# Patient Record
Sex: Male | Born: 1970
Health system: Southern US, Community
[De-identification: ages and names within clinical notes are randomized; demographics above are authoritative.]

## PROBLEM LIST (undated history)

## (undated) DIAGNOSIS — L309 Dermatitis, unspecified: Secondary | ICD-10-CM

## (undated) DIAGNOSIS — G4730 Sleep apnea, unspecified: Secondary | ICD-10-CM

## (undated) DIAGNOSIS — M549 Dorsalgia, unspecified: Secondary | ICD-10-CM

## (undated) HISTORY — PX: OTHER SURGICAL HISTORY: SHX169

## (undated) HISTORY — PX: COLONOSCOPY: SHX174

## (undated) HISTORY — PX: APPENDECTOMY: SHX54

---

## 1999-04-20 ENCOUNTER — Emergency Department (HOSPITAL_COMMUNITY): Admission: EM | Admit: 1999-04-20 | Discharge: 1999-04-20 | Payer: Self-pay | Admitting: *Deleted

## 2001-09-26 ENCOUNTER — Emergency Department (HOSPITAL_COMMUNITY): Admission: EM | Admit: 2001-09-26 | Discharge: 2001-09-27 | Payer: Self-pay | Admitting: Emergency Medicine

## 2011-08-29 ENCOUNTER — Other Ambulatory Visit: Payer: Self-pay | Admitting: Physician Assistant

## 2011-09-01 ENCOUNTER — Telehealth: Payer: Self-pay

## 2011-09-01 NOTE — Telephone Encounter (Signed)
[facility] HAS REQUESTED REFILLS ON PT'S CIALIS.  PT WOULD LIKE REFILLS FAXED TO [phone] AS SOON AS WE CAN

## 2011-09-02 NOTE — Telephone Encounter (Signed)
Pull chart please 

## 2011-09-03 NOTE — Telephone Encounter (Signed)
Patient has not been seen since 12/11. He needs an office visit for refills

## 2011-09-03 NOTE — Telephone Encounter (Signed)
Chart is at nurses station

## 2011-09-03 NOTE — Telephone Encounter (Signed)
lmom that pt must RTC

## 2011-09-04 ENCOUNTER — Telehealth: Payer: Self-pay

## 2011-09-04 NOTE — Telephone Encounter (Signed)
PT WOULD ONLY LIKE DR DAUB TO GIVE HIM A CALL AND IT ISN'T ABOUT MEDICINE OR DIAGNOSIS PLEASE CALL [phone] AND HE UNDERSTAND DR DAUB ISN'T IN UNTIL Thursday

## 2011-09-04 NOTE — Telephone Encounter (Signed)
Message left on answering machine for him to call me at home tonight.

## 2011-09-06 ENCOUNTER — Other Ambulatory Visit: Payer: Self-pay | Admitting: *Deleted

## 2011-09-06 MED ORDER — TADALAFIL 20 MG PO TABS: ORAL_TABLET | ORAL | Status: DC

## 2012-03-21 ENCOUNTER — Telehealth: Payer: Self-pay

## 2012-03-21 NOTE — Telephone Encounter (Signed)
Do you know what med she is referring to?

## 2012-03-21 NOTE — Telephone Encounter (Signed)
PT STATES DR DAUB WANTED [place] TO PUT THIS MESSAGE IN HIS BOX TO REMIND HIM ABOUT HIS REFILLS. STATES HE KNEW WHICH ONES THEY ARE PLEASE CALL PT IF NEEDED AT [phone]

## 2012-03-22 MED ORDER — TADALAFIL 20 MG PO TABS: ORAL_TABLET | ORAL | Status: DC

## 2012-03-22 NOTE — Telephone Encounter (Signed)
Please call in Cialis 20 mg. He takes one half tablet one hour prior to intercourse up to every 48 hours. Call in #6 refill x1 year

## 2012-07-07 ENCOUNTER — Encounter (HOSPITAL_COMMUNITY): Payer: Self-pay | Admitting: *Deleted

## 2012-07-07 ENCOUNTER — Emergency Department (HOSPITAL_COMMUNITY): Payer: Worker's Compensation

## 2012-07-07 ENCOUNTER — Emergency Department (HOSPITAL_COMMUNITY)
Admission: EM | Admit: 2012-07-07 | Discharge: 2012-07-08 | Disposition: A | Discharge status: Home / Self Care | Payer: Worker's Compensation | Attending: Emergency Medicine | Admitting: Emergency Medicine

## 2012-07-07 DIAGNOSIS — M545 Low back pain, unspecified: Secondary | ICD-10-CM | POA: Insufficient documentation

## 2012-07-07 DIAGNOSIS — M549 Dorsalgia, unspecified: Secondary | ICD-10-CM

## 2012-07-07 DIAGNOSIS — R209 Unspecified disturbances of skin sensation: Secondary | ICD-10-CM | POA: Insufficient documentation

## 2012-07-07 DIAGNOSIS — Z79899 Other long term (current) drug therapy: Secondary | ICD-10-CM | POA: Insufficient documentation

## 2012-07-07 DIAGNOSIS — R32 Unspecified urinary incontinence: Secondary | ICD-10-CM

## 2012-07-07 DIAGNOSIS — G8929 Other chronic pain: Secondary | ICD-10-CM | POA: Insufficient documentation

## 2012-07-07 LAB — URINALYSIS, ROUTINE W REFLEX MICROSCOPIC
Glucose, UA: NEGATIVE mg/dL
Hgb urine dipstick: NEGATIVE
Ketones, ur: NEGATIVE mg/dL
Protein, ur: NEGATIVE mg/dL

## 2012-07-07 NOTE — ED Provider Notes (Signed)
Medical screening examination/treatment/procedure(s) were performed by non-physician practitioner and as supervising physician I was immediately available for consultation/collaboration. Devoria Albe, MD, Armando Gang   Ward Givens, MD 07/07/12 [phone]

## 2012-07-07 NOTE — ED Notes (Signed)
The pt has chronic back pain and for the past 2 weeks the pt has had more pain

## 2012-07-07 NOTE — ED Notes (Addendum)
Pt. Reports lower back pain x2 week, was lifting something heavy. States shooting pain from lower back to left leg when walking. Also reports inability to bend over. Pt. Ambulatory upon arrival but states intermittently has trouble walking. Pt. Alert and oriented x4. Pt. Laying flat currently.

## 2012-07-07 NOTE — ED Provider Notes (Signed)
History    This chart was scribed for non-physician practitioner Raymon Mutton working with Ward Givens, MD by Quintella Reichert, ED Scribe. This patient was seen in room TR08C/TR08C and the patient's care was started at 7:43 PM .   CSN: [id number]  Arrival date & time 07/07/12  1910      Chief Complaint  Patient presents with  . Back Pain     The history is provided by the patient. No language interpreter was used.   Martin Velazquez is a 42 y.o. male who presents to the Emergency Department complaining of constant, severe left-side lower back pain.  Pt states he sustained a ruptured disc ( in an injury 9 years ago, and subsequently experienced intermittent, moderate lower back pain.  2 weeks ago he sustained another injury, which precipitated a return of the same pain, but much more severe and constant than previously.  He describes pain as aching and currently at a severity of 7/10,  and states it is exacerbated by bending forward.  He reports that pain radiates down left leg.  He also reports numbness to inner left thigh and left foot.  Pt also reports urinary incontinence (3 episodes) and erectile dysfunction with onset 4 days ago.  He denies prior h/o either of these symptoms.  He also reports mild testicular aching and a tingling sensation in head of penis.  Pt denies CP, SOB, abdominal pain, nausea, emesis, diarrhea, facial asymmetry, trouble swallowing, weakness, or any other associated symptoms.  He was medicating with prednisone, but ran out several days ago.  Pt also has a prescription for hydrocodone but has not been taking it.  Neurosurgeon is Dr. Lovell Sheehan.   History reviewed. No pertinent past medical history.  History reviewed. No pertinent past surgical history.  No family history on file.  History  Substance Use Topics  . Smoking status: Never Smoker   . Smokeless tobacco: Not on file  . Alcohol Use: No      Review of Systems  HENT: Negative for trouble  swallowing.   Respiratory: Negative for shortness of breath.   Cardiovascular: Negative for chest pain.  Gastrointestinal: Negative for nausea, vomiting, abdominal pain and diarrhea.  Genitourinary:       Urinary incontinence, erectile dysfunction, tingling to glans penis  Musculoskeletal: Positive for back pain.  Neurological: Positive for numbness (Inner thigh and left foot). Negative for facial asymmetry and weakness.  All other systems reviewed and are negative.    Allergies  Review of patient's allergies indicates not on file.  Home Medications   Current Outpatient Rx  Name  Route  Sig  Dispense  Refill  . Multiple Vitamin (MULTIVITAMIN WITH MINERALS) TABS   Oral   Take 1 tablet by mouth daily.         . tadalafil (CIALIS) 20 MG tablet      Take 1/2 to 1 tablet prior to intercourse   6 tablet   11   . predniSONE (DELTASONE) 20 MG tablet   Oral   Take 20 mg by mouth daily. 6 tabs, 5 tabs, 4 tabs, 3 tabs, 2 tabs, 1 tab, then took 1 tab qd for 3 or 4 days, then stopped.           BP 150/71  Pulse 58  Temp(Src) 98.6 F (37 C) (Oral)  Resp 18  SpO2 99%  Physical Exam  Nursing note and vitals reviewed. Constitutional: He is oriented to person, place, and time. He appears well-developed and  well-nourished. No distress.  HENT:  Head: Normocephalic and atraumatic.  Mouth/Throat: Oropharynx is clear and moist. No oropharyngeal exudate.  Eyes: Conjunctivae and EOM are normal. Pupils are equal, round, and reactive to light. Right eye exhibits no discharge. Left eye exhibits no discharge.  Neck: Normal range of motion. Neck supple. No tracheal deviation present.  Negative lymphadenopathy  Cardiovascular: Normal rate, regular rhythm and normal heart sounds.   No murmur heard. Pedal pulses bounding, 2+ bilaterally  Pulmonary/Chest: Effort normal and breath sounds normal. No respiratory distress. He has no wheezes. He has no rales.  Genitourinary: Rectum normal.   Musculoskeletal: He exhibits no edema and no tenderness.  Decreased flexion to back, restricted due to pain. Normal extension of back. Full flexion of hips. Decreased flexion of left knee due to radiation of pain in lower back. Full ROM in feet.    Lymphadenopathy:    He has no cervical adenopathy.  Neurological: He is alert and oriented to person, place, and time. He has normal reflexes. No cranial nerve deficit. He exhibits normal muscle tone. Coordination normal.  Decreased sensation to inner aspect of left thigh.  Skin: Skin is warm and dry. No rash noted. No erythema.  Psychiatric: He has a normal mood and affect. His behavior is normal. Thought content normal.    ED Course  Procedures (including critical care time)  DIAGNOSTIC STUDIES: Oxygen Saturation is 99% on room air, normal by my interpretation.    COORDINATION OF CARE: 7:59 PM-Discussed treatment plan which includes imaging with pt at bedside and pt agreed to plan. Pt declines pain medication currently.     Labs Reviewed  URINALYSIS, ROUTINE W REFLEX MICROSCOPIC   Mr Lumbar Spine Wo Contrast  07/07/2012  *RADIOLOGY REPORT*  Clinical Data: 42 year old male with severe left side low back pain.  Urinary incontinence.  History of L5-S1 herniated disc.  MRI LUMBAR SPINE WITHOUT CONTRAST  Technique:  Multiplanar and multiecho pulse sequences of the lumbar spine were obtained without intravenous contrast.  Comparison: [facility] lumbar MRI 03/09/2007.  Findings: Lumbar segmentation appears to be normal, and by report this corresponds to the numbering system used on the comparison.  Stable vertebral height and alignment.  Trace retrolisthesis of L5 on S1.  Mild if any marrow edema at the right L5-S1 facet joint. No marrow edema or evidence of acute osseous abnormality elsewhere.   Visualized lower thoracic spinal cord is normal with conus medularis at T12-L1. Visualized abdominal viscera and paraspinal soft tissues  are within normal limits.  There is a degree of lumbar congenital spinal canal narrowing related to short pedicles.  The following superimposed degenerative changes are noted:  T11-T12:  Small chronic inferior T11 endplate Schmorl's node.  Mild to moderate left facet hypertrophy.  Mild left T11 foraminal stenosis.  This appears not significantly changed.  T12-L1:  Negative.  L1-L2:  Minor disc desiccation.  Chronic small right paracentral annular tear and disc protrusion (series 6 image 4).  Mild left facet hypertrophy.  Mild right lateral recess stenosis.  L2-L3:  Negative.  L3-L4:  Negative.  Mild bilateral L3 foraminal stenosis, mostly congenital.  L4-L5:  Negative disc.  Mild to moderate facet hypertrophy.  Mild to moderate left L4 foraminal stenosis not significantly changed.  L5-S1:  Chronic disc desiccation and circumferential disc osteophyte complex.  Interval increased disc space loss. Regression of the previously seen central annular tear.  Left paracentral disc protrusion mostly effecting the left lateral recess (series 6 image 29) at the descending  left S1 nerve root level. The left side lateral recess stenosis is moderate and increased.  Superimposed mild facet hypertrophy.  No significant spinal stenosis.  Mild to moderate left L5 foraminal stenosis is stable.  IMPRESSION:  1.  Chronic L5-S1 disc degeneration with interval increased left lateral recess stenosis related primarily to a small disc protrusion.  Chronic mild to moderate left L5 foraminal stenosis is stable. 2.  Congenital lumbar spinal canal narrowing related short pedicles. 3. Stable lumbar spine outside of the L5-S1 levels including a small right paracentral disc protrusion and annular tear at L1-L2, and mild to moderate multifactorial left L4 foraminal stenosis.   Original Report Authenticated By: Erskine Speed, M.D.      1. Back pain   2. Urinary incontinence       MDM  I personally performed the services described in this  documentation, which was scribed in my presence. The recorded information has been reviewed and is accurate.  With presence of sudden urinary incontinence, erectile dysfunction, tingling and numbness to glans penis, decreased sensation to inner aspect of left thigh upon physical exam - worrisome for Cauda Equina Syndrome. MRI ordered. Patient was transferred from [facility] to Pod B. Discussed case with Elpidio Anis, PA-C; transfer of care to Elpidio Anis, PA-C at 9:00pm 07/07/2012. S. Upstill, PA-C spoke with Dr. Venetia Maxon for neurosurgery consulted at 9:22pm - MRI acknowledged. MRI of lumbar spine: chronic disc degeneration and foraminal stenosis noted throughout lumbar spine - annular tear noted at L1-L2. UA negative findings. Patient was discharged and recommended to follow-up with Dr. Lovell Sheehan as outpatient.     Raymon Mutton, PA-C 07/08/12 1108

## 2012-07-07 NOTE — ED Notes (Addendum)
Patient had spinal injury  Back in 2005 L5-S1.  Patient states that he has lost control of his bladder and cannot have an erection. Patient also reports having numbness and tingling in his penis and testicles.  Patient in supine position, PA did not advise placing patient on back board or putting patient in C-Collar.  Provider and charge nurse notified patient will move to Pod B room 15

## 2012-07-07 NOTE — ED Provider Notes (Signed)
Patient seen with Santo Held, PA-C. Complaint of back pain since heavy lifting 2 weeks ago that radiated into left leg. Pain has been persistent. Three days ago, he began to have urinary incontinence, noticing wet clothing and sheets when he got up in the morning, and sometimes being unable to get to the bathroom before losing some urine. He does have periods when he maintains full control. No numbness to rectal area, no bowel incontinence. He is also having difficulty with erections, being unable to have an erect penis. No  Abdominal pain. PE: No swelling to back, lumbar area non-tender.  Abdomen soft and non-tender. FROM lower extremities with equal DTR's. No strength deficits. Slightly decreased sensation to left medial thigh.  No loss of rectal tone.  Discussed with Dr. Venetia Maxon who recommended getting an MRI this evening given history of incontinence. MRI shows chronic/stable changes. Patient can be discharged home and will follow up with Dr. Lovell Sheehan (history of having been seen by him) in the office.   Arnoldo Hooker, PA-C 07/07/12 2357

## 2012-07-10 NOTE — ED Provider Notes (Signed)
Medical screening examination/treatment/procedure(s) were performed by non-physician practitioner and as supervising physician I was immediately available for consultation/collaboration. Kayse Puccini, MD, FACEP   Hibba Schram L Shere Eisenhart, MD 07/10/12 1055 

## 2012-07-19 ENCOUNTER — Other Ambulatory Visit: Payer: Self-pay | Admitting: Neurosurgery

## 2012-07-24 ENCOUNTER — Encounter (HOSPITAL_COMMUNITY): Payer: Self-pay

## 2012-07-24 ENCOUNTER — Encounter (HOSPITAL_COMMUNITY)
Admission: RE | Admit: 2012-07-24 | Discharge: 2012-07-24 | Disposition: A | Discharge status: Home / Self Care | Payer: 59 | Source: Ambulatory Visit | Attending: Neurosurgery | Admitting: Neurosurgery

## 2012-07-24 HISTORY — DX: Dermatitis, unspecified: L30.9

## 2012-07-24 HISTORY — DX: Sleep apnea, unspecified: G47.30

## 2012-07-24 HISTORY — DX: Dorsalgia, unspecified: M54.9

## 2012-07-24 LAB — BASIC METABOLIC PANEL
BUN: 22 mg/dL (ref 6–23)
Chloride: 101 mEq/L (ref 96–112)
GFR calc Af Amer: 89 mL/min — ABNORMAL LOW (ref >90)
Glucose, Bld: 83 mg/dL (ref 70–99)
Potassium: 4.2 mEq/L (ref 3.5–5.1)
Sodium: 138 mEq/L (ref 135–145)

## 2012-07-24 LAB — CBC
HCT: 37.8 % — ABNORMAL LOW (ref 39.0–52.0)
Hemoglobin: 13.4 g/dL (ref 13.0–17.0)
MCH: 30.5 pg (ref 26.0–34.0)
MCHC: 35.4 g/dL (ref 30.0–36.0)
RBC: 4.39 MIL/uL (ref 4.22–5.81)

## 2012-07-24 NOTE — Progress Notes (Addendum)
Pt doesn't have a cardiologist  Stress test done about 2-72months ago with [facility]--done for work-normal  EkG has been done within the past couple of months-was done for work.states it was normal  Denies echo or heart cath  Pt doesn't have a medical MD  Denies CXR in past yr

## 2012-07-24 NOTE — Pre-Procedure Instructions (Signed)
Martin Velazquez  07/24/2012   Your procedure is scheduled on:  Thurs, May 8 @ 10:05 AM  Report to [facility] at 7:00 AM.  Call this number if you have problems the morning of surgery: [phone]   Remember:   Do not eat food or drink liquids after midnight.      Do not wear jewelry.  Do not wear lotions, powders, or colognes. You may wear deodorant.  Men may shave face and neck.  Do not bring valuables to the hospital.  Contacts, dentures or bridgework may not be worn into surgery.  Leave suitcase in the car. After surgery it may be brought to your room.  For patients admitted to the hospital, checkout time is 11:00 AM the day of  discharge.   Patients discharged the day of surgery will not be allowed to drive  home.    Special Instructions: Shower using CHG 2 nights before surgery and the night before surgery.  If you shower the day of surgery use CHG.  Use special wash - you have one bottle of CHG for all showers.  You should use approximately 1/3 of the bottle for each shower.   Please read over the following fact sheets that you were given: Pain Booklet, Coughing and Deep Breathing, MRSA Information and Surgical Site Infection Prevention

## 2012-07-25 ENCOUNTER — Encounter (HOSPITAL_COMMUNITY)
Admission: RE | Disposition: A | Discharge status: Home / Self Care | Payer: Self-pay | Source: Ambulatory Visit | Attending: Neurosurgery

## 2012-07-25 ENCOUNTER — Ambulatory Visit (HOSPITAL_COMMUNITY): Payer: 59

## 2012-07-25 ENCOUNTER — Encounter (HOSPITAL_COMMUNITY): Payer: Self-pay | Admitting: Certified Registered Nurse Anesthetist

## 2012-07-25 ENCOUNTER — Ambulatory Visit (HOSPITAL_COMMUNITY)
Admission: RE | Admit: 2012-07-25 | Discharge: 2012-07-25 | Disposition: A | Discharge status: Home / Self Care | Payer: 59 | Source: Ambulatory Visit | Attending: Neurosurgery | Admitting: Neurosurgery

## 2012-07-25 ENCOUNTER — Ambulatory Visit (HOSPITAL_COMMUNITY): Payer: 59 | Admitting: Certified Registered Nurse Anesthetist

## 2012-07-25 DIAGNOSIS — Z01812 Encounter for preprocedural laboratory examination: Secondary | ICD-10-CM | POA: Insufficient documentation

## 2012-07-25 DIAGNOSIS — M5126 Other intervertebral disc displacement, lumbar region: Secondary | ICD-10-CM | POA: Insufficient documentation

## 2012-07-25 HISTORY — PX: LUMBAR LAMINECTOMY/DECOMPRESSION MICRODISCECTOMY: SHX5026

## 2012-07-25 SURGERY — LUMBAR LAMINECTOMY/DECOMPRESSION MICRODISCECTOMY 1 LEVEL
Anesthesia: General | Site: Back | Wound class: Clean

## 2012-07-25 MED ORDER — CEFAZOLIN SODIUM-DEXTROSE 2-3 GM-% IV SOLR: 2.0000 g | INTRAVENOUS | Status: DC

## 2012-07-25 MED ORDER — ROCURONIUM BROMIDE 100 MG/10ML IV SOLN
INTRAVENOUS | Status: DC | PRN
  Administered 2012-07-25: 50 mg via INTRAVENOUS

## 2012-07-25 MED ORDER — HYDROCODONE-ACETAMINOPHEN 5-325 MG PO TABS: 1.0000 | ORAL_TABLET | ORAL | Status: DC | PRN

## 2012-07-25 MED ORDER — OXYCODONE-ACETAMINOPHEN 5-325 MG PO TABS: 1.0000 | ORAL_TABLET | ORAL | Status: DC | PRN

## 2012-07-25 MED ORDER — ONDANSETRON HCL 4 MG/2ML IJ SOLN: 4.0000 mg | INTRAMUSCULAR | Status: DC | PRN

## 2012-07-25 MED ORDER — ACETAMINOPHEN 650 MG RE SUPP: 650.0000 mg | RECTAL | Status: DC | PRN

## 2012-07-25 MED ORDER — OXYCODONE HCL 5 MG PO TABS: 5.0000 mg | ORAL_TABLET | Freq: Once | ORAL | Status: DC | PRN

## 2012-07-25 MED ORDER — BUPIVACAINE-EPINEPHRINE PF 0.5-1:200000 % IJ SOLN
INTRAMUSCULAR | Status: DC | PRN
  Administered 2012-07-25 (×2): 10 mL

## 2012-07-25 MED ORDER — BACITRACIN 50000 UNITS IM SOLR
INTRAMUSCULAR | Status: AC
  Filled 2012-07-25: qty 1

## 2012-07-25 MED ORDER — ARTIFICIAL TEARS OP OINT
TOPICAL_OINTMENT | OPHTHALMIC | Status: DC | PRN
  Administered 2012-07-25: 1 via OPHTHALMIC

## 2012-07-25 MED ORDER — SODIUM CHLORIDE 0.9 % IV SOLN
INTRAVENOUS | Status: AC
  Filled 2012-07-25: qty 500

## 2012-07-25 MED ORDER — HYDROMORPHONE HCL PF 1 MG/ML IJ SOLN: 0.2500 mg | INTRAMUSCULAR | Status: DC | PRN

## 2012-07-25 MED ORDER — LACTATED RINGERS IV SOLN
INTRAVENOUS | Status: DC | PRN
  Administered 2012-07-25 (×2): via INTRAVENOUS

## 2012-07-25 MED ORDER — LACTATED RINGERS IV SOLN: INTRAVENOUS | Status: DC

## 2012-07-25 MED ORDER — DSS 100 MG PO CAPS: 100.0000 mg | ORAL_CAPSULE | Freq: Two times a day (BID) | ORAL | Status: DC

## 2012-07-25 MED ORDER — BACITRACIN ZINC 500 UNIT/GM EX OINT
TOPICAL_OINTMENT | CUTANEOUS | Status: DC | PRN
  Administered 2012-07-25: 1 via TOPICAL

## 2012-07-25 MED ORDER — FENTANYL CITRATE 0.05 MG/ML IJ SOLN
INTRAMUSCULAR | Status: DC | PRN
  Administered 2012-07-25 (×2): 75 ug via INTRAVENOUS
  Administered 2012-07-25: 50 ug via INTRAVENOUS

## 2012-07-25 MED ORDER — ONDANSETRON HCL 4 MG/2ML IJ SOLN
INTRAMUSCULAR | Status: DC | PRN
  Administered 2012-07-25: 4 mg via INTRAVENOUS

## 2012-07-25 MED ORDER — MIDAZOLAM HCL 5 MG/5ML IJ SOLN
INTRAMUSCULAR | Status: DC | PRN
  Administered 2012-07-25 (×2): 2 mg via INTRAVENOUS

## 2012-07-25 MED ORDER — GLYCOPYRROLATE 0.2 MG/ML IJ SOLN
INTRAMUSCULAR | Status: DC | PRN
  Administered 2012-07-25: .6 mg via INTRAVENOUS

## 2012-07-25 MED ORDER — LIDOCAINE HCL 4 % MT SOLN
OROMUCOSAL | Status: DC | PRN
  Administered 2012-07-25: 4 mL via TOPICAL

## 2012-07-25 MED ORDER — PROPOFOL 10 MG/ML IV BOLUS
INTRAVENOUS | Status: DC | PRN
  Administered 2012-07-25: 200 mg via INTRAVENOUS

## 2012-07-25 MED ORDER — ACETAMINOPHEN 325 MG PO TABS: 650.0000 mg | ORAL_TABLET | ORAL | Status: DC | PRN

## 2012-07-25 MED ORDER — HEMOSTATIC AGENTS (NO CHARGE) OPTIME
TOPICAL | Status: DC | PRN
  Administered 2012-07-25: 1 via TOPICAL

## 2012-07-25 MED ORDER — THROMBIN 5000 UNITS EX SOLR
CUTANEOUS | Status: DC | PRN
  Administered 2012-07-25 (×2): 5000 [IU] via TOPICAL

## 2012-07-25 MED ORDER — ONDANSETRON HCL 4 MG/2ML IJ SOLN: 4.0000 mg | Freq: Four times a day (QID) | INTRAMUSCULAR | Status: DC | PRN

## 2012-07-25 MED ORDER — SODIUM CHLORIDE 0.9 % IR SOLN
Status: DC | PRN
  Administered 2012-07-25: 11:00:00

## 2012-07-25 MED ORDER — 0.9 % SODIUM CHLORIDE (POUR BTL) OPTIME
TOPICAL | Status: DC | PRN
  Administered 2012-07-25: 1000 mL

## 2012-07-25 MED ORDER — ALBUTEROL SULFATE HFA 108 (90 BASE) MCG/ACT IN AERS
INHALATION_SPRAY | RESPIRATORY_TRACT | Status: DC | PRN
  Administered 2012-07-25: 6 via RESPIRATORY_TRACT

## 2012-07-25 MED ORDER — MORPHINE SULFATE 2 MG/ML IJ SOLN: 1.0000 mg | INTRAMUSCULAR | Status: DC | PRN

## 2012-07-25 MED ORDER — OXYCODONE HCL 5 MG/5ML PO SOLN: 5.0000 mg | Freq: Once | ORAL | Status: DC | PRN

## 2012-07-25 MED ORDER — DIAZEPAM 5 MG PO TABS: 5.0000 mg | ORAL_TABLET | Freq: Four times a day (QID) | ORAL | Status: DC | PRN

## 2012-07-25 MED ORDER — PHENOL 1.4 % MT LIQD: 1.0000 | OROMUCOSAL | Status: DC | PRN

## 2012-07-25 MED ORDER — EPHEDRINE SULFATE 50 MG/ML IJ SOLN
INTRAMUSCULAR | Status: DC | PRN
  Administered 2012-07-25 (×7): 5 mg via INTRAVENOUS
  Administered 2012-07-25 (×2): 15 mg via INTRAVENOUS

## 2012-07-25 MED ORDER — NEOSTIGMINE METHYLSULFATE 1 MG/ML IJ SOLN
INTRAMUSCULAR | Status: DC | PRN
  Administered 2012-07-25: 4 mg via INTRAVENOUS

## 2012-07-25 MED ORDER — DOCUSATE SODIUM 100 MG PO CAPS: 100.0000 mg | ORAL_CAPSULE | Freq: Two times a day (BID) | ORAL | Status: DC

## 2012-07-25 MED ORDER — LIDOCAINE HCL (CARDIAC) 20 MG/ML IV SOLN
INTRAVENOUS | Status: DC | PRN
  Administered 2012-07-25: 65 mg via INTRAVENOUS

## 2012-07-25 MED ORDER — VECURONIUM BROMIDE 10 MG IV SOLR
INTRAVENOUS | Status: DC | PRN
  Administered 2012-07-25: 2 mg via INTRAVENOUS
  Administered 2012-07-25: 1 mg via INTRAVENOUS

## 2012-07-25 MED ORDER — MENTHOL 3 MG MT LOZG: 1.0000 | LOZENGE | OROMUCOSAL | Status: DC | PRN

## 2012-07-25 MED ORDER — ALUM & MAG HYDROXIDE-SIMETH 200-200-20 MG/5ML PO SUSP: 30.0000 mL | Freq: Four times a day (QID) | ORAL | Status: DC | PRN

## 2012-07-25 MED ORDER — PHENYLEPHRINE HCL 10 MG/ML IJ SOLN
INTRAMUSCULAR | Status: DC | PRN
  Administered 2012-07-25: 80 ug via INTRAVENOUS

## 2012-07-25 MED ORDER — DEXAMETHASONE SODIUM PHOSPHATE 4 MG/ML IJ SOLN
INTRAMUSCULAR | Status: DC | PRN
Start: 2012-07-25 — End: 2012-07-25
  Administered 2012-07-25: 10 mg via INTRAVENOUS

## 2012-07-25 MED ORDER — ADULT MULTIVITAMIN W/MINERALS CH: 1.0000 | ORAL_TABLET | Freq: Every day | ORAL | Status: DC

## 2012-07-25 MED ORDER — CEFAZOLIN SODIUM-DEXTROSE 2-3 GM-% IV SOLR
INTRAVENOUS | Status: AC
  Administered 2012-07-25: 2 g via INTRAVENOUS
  Filled 2012-07-25: qty 50

## 2012-07-25 MED ORDER — CEFAZOLIN SODIUM-DEXTROSE 2-3 GM-% IV SOLR
2.0000 g | Freq: Three times a day (TID) | INTRAVENOUS | Status: DC
  Filled 2012-07-25 (×2): qty 50

## 2012-07-25 SURGICAL SUPPLY — 60 items
APL SKNCLS STERI-STRIP NONHPOA (GAUZE/BANDAGES/DRESSINGS) ×1
BAG DECANTER FOR FLEXI CONT (MISCELLANEOUS) ×2 IMPLANT
BENZOIN TINCTURE PRP APPL 2/3 (GAUZE/BANDAGES/DRESSINGS) ×2 IMPLANT
BLADE SURG ROTATE 9660 (MISCELLANEOUS) IMPLANT
BRUSH SCRUB EZ PLAIN DRY (MISCELLANEOUS) ×2 IMPLANT
BUR ACORN 6.0 (BURR) ×2 IMPLANT
BUR ACORN 6.0 ACORN (BURR) ×1 IMPLANT
BUR MATCHSTICK NEURO 3.0 LAGG (BURR) ×2 IMPLANT
CANISTER SUCTION 2500CC (MISCELLANEOUS) ×2 IMPLANT
CLOTH BEACON ORANGE TIMEOUT ST (SAFETY) ×2 IMPLANT
CONT SPEC 4OZ CLIKSEAL STRL BL (MISCELLANEOUS) ×2 IMPLANT
DRAPE LAPAROTOMY 100X72X124 (DRAPES) ×2 IMPLANT
DRAPE MICROSCOPE LEICA (MISCELLANEOUS) ×2 IMPLANT
DRAPE POUCH INSTRU U-SHP 10X18 (DRAPES) ×2 IMPLANT
DRAPE SURG 17X23 STRL (DRAPES) ×8 IMPLANT
ELECT BLADE 4.0 EZ CLEAN MEGAD (MISCELLANEOUS) ×2
ELECT REM PT RETURN 9FT ADLT (ELECTROSURGICAL) ×2
ELECTRODE BLDE 4.0 EZ CLN MEGD (MISCELLANEOUS) ×1 IMPLANT
ELECTRODE REM PT RTRN 9FT ADLT (ELECTROSURGICAL) ×1 IMPLANT
GAUZE SPONGE 4X4 16PLY XRAY LF (GAUZE/BANDAGES/DRESSINGS) IMPLANT
GLOVE BIO SURGEON STRL SZ8 (GLOVE) ×2 IMPLANT
GLOVE BIO SURGEON STRL SZ8.5 (GLOVE) ×2 IMPLANT
GLOVE BIOGEL PI IND STRL 8 (GLOVE) IMPLANT
GLOVE BIOGEL PI IND STRL 8.5 (GLOVE) IMPLANT
GLOVE BIOGEL PI INDICATOR 8 (GLOVE) ×2
GLOVE BIOGEL PI INDICATOR 8.5 (GLOVE) ×2
GLOVE ECLIPSE 7.5 STRL STRAW (GLOVE) ×1 IMPLANT
GLOVE EXAM NITRILE LRG STRL (GLOVE) IMPLANT
GLOVE EXAM NITRILE MD LF STRL (GLOVE) IMPLANT
GLOVE EXAM NITRILE XL STR (GLOVE) IMPLANT
GLOVE EXAM NITRILE XS STR PU (GLOVE) IMPLANT
GLOVE INDICATOR 7.5 STRL GRN (GLOVE) ×1 IMPLANT
GLOVE SS BIOGEL STRL SZ 8 (GLOVE) ×1 IMPLANT
GLOVE SUPERSENSE BIOGEL SZ 8 (GLOVE) ×1
GOWN BRE IMP SLV AUR LG STRL (GOWN DISPOSABLE) ×1 IMPLANT
GOWN BRE IMP SLV AUR XL STRL (GOWN DISPOSABLE) ×2 IMPLANT
GOWN STRL REIN 2XL LVL4 (GOWN DISPOSABLE) ×1 IMPLANT
KIT BASIN OR (CUSTOM PROCEDURE TRAY) ×2 IMPLANT
KIT ROOM TURNOVER OR (KITS) ×2 IMPLANT
NDL HYPO 21X1.5 SAFETY (NEEDLE) IMPLANT
NEEDLE HYPO 21X1.5 SAFETY (NEEDLE) IMPLANT
NEEDLE HYPO 22GX1.5 SAFETY (NEEDLE) ×2 IMPLANT
NS IRRIG 1000ML POUR BTL (IV SOLUTION) ×2 IMPLANT
PACK LAMINECTOMY NEURO (CUSTOM PROCEDURE TRAY) ×2 IMPLANT
PAD ARMBOARD 7.5X6 YLW CONV (MISCELLANEOUS) ×6 IMPLANT
PATTIES SURGICAL .5 X1 (DISPOSABLE) IMPLANT
RUBBERBAND STERILE (MISCELLANEOUS) ×4 IMPLANT
SLEEVE SURGEON STRL (DRAPES) ×1 IMPLANT
SPONGE GAUZE 4X4 12PLY (GAUZE/BANDAGES/DRESSINGS) ×2 IMPLANT
SPONGE SURGIFOAM ABS GEL SZ50 (HEMOSTASIS) ×2 IMPLANT
STRIP CLOSURE SKIN 1/2X4 (GAUZE/BANDAGES/DRESSINGS) ×2 IMPLANT
SUT VIC AB 1 CT1 18XBRD ANBCTR (SUTURE) ×1 IMPLANT
SUT VIC AB 1 CT1 8-18 (SUTURE) ×4
SUT VIC AB 2-0 CP2 18 (SUTURE) ×4 IMPLANT
SYR 20CC LL (SYRINGE) IMPLANT
SYR 20ML ECCENTRIC (SYRINGE) ×2 IMPLANT
TAPE CLOTH SURG 4X10 WHT LF (GAUZE/BANDAGES/DRESSINGS) ×1 IMPLANT
TOWEL OR 17X24 6PK STRL BLUE (TOWEL DISPOSABLE) ×2 IMPLANT
TOWEL OR 17X26 10 PK STRL BLUE (TOWEL DISPOSABLE) ×2 IMPLANT
WATER STERILE IRR 1000ML POUR (IV SOLUTION) ×2 IMPLANT

## 2012-07-25 NOTE — Transfer of Care (Signed)
Immediate Anesthesia Transfer of Care Note  Patient: Martin Velazquez  Procedure(s) Performed: Procedure(s) with comments: Lumbar five-sacral one diskectomy (N/A) - Lumbar five-sacral one diskectomy  Patient Location: PACU  Anesthesia Type:General  Level of Consciousness: awake, alert , oriented and patient cooperative  Airway & Oxygen Therapy: Patient Spontanous Breathing and Patient connected to nasal cannula oxygen  Post-op Assessment: Report given to PACU RN, Post -op Vital signs reviewed and stable and Patient moving all extremities X 4  Post vital signs: Reviewed and stable  Complications: No apparent anesthesia complications

## 2012-07-25 NOTE — Anesthesia Postprocedure Evaluation (Signed)
Anesthesia Post Note  Patient: Martin Velazquez  Procedure(s) Performed: Procedure(s) (LRB): Lumbar five-sacral one diskectomy (N/A)  Anesthesia type: General  Patient location: PACU  Post pain: Pain level controlled and Adequate analgesia  Post assessment: Post-op Vital signs reviewed, Patient's Cardiovascular Status Stable, Respiratory Function Stable, Patent Airway and Pain level controlled  Last Vitals:  Filed Vitals:   07/25/12 1241  BP:   Pulse: 56  Temp:   Resp: 13    Post vital signs: Reviewed and stable  Level of consciousness: awake, alert  and oriented  Complications: No apparent anesthesia complications

## 2012-07-25 NOTE — Discharge Summary (Signed)
Physician Discharge Summary  Patient ID: Martin Velazquez MRN: [id number] DOB/AGE: [birth year] 42 y.o.  Admit date: 07/25/2012 Discharge date: 07/25/2012  Admission Diagnoses: Left L5-S1 herniated disc, lumbago, lumbar radiculopathy  Discharge Diagnoses: The same Active Problems:   * No active hospital problems. *   Discharged Condition: good  Hospital Course: I performed a left L5-S1 discectomy on the patient on 07/25/2012. The surgery went well. The patient's postoperative course was unremarkable. On the afternoon of surgery the patient requested discharge to home. The patient and his wife were given oral and written discharge instructions. All his questions were answered.  Consults: None Significant Diagnostic Studies: None Treatments: Left L5-S1 discectomy using microdissection Discharge Exam: Blood pressure 129/68, pulse 60, temperature 97.6 F (36.4 C), temperature source Oral, resp. rate 20, SpO2 93.00%. The patient is alert and pleasant. His lower extremity strength is grossly normal. His leg pain is gone.  Disposition: Home  Discharge Orders   Future Orders Complete By Expires     Call MD for:  difficulty breathing, headache or visual disturbances  As directed     Call MD for:  extreme fatigue  As directed     Call MD for:  hives  As directed     Call MD for:  persistant dizziness or light-headedness  As directed     Call MD for:  persistant nausea and vomiting  As directed     Call MD for:  redness, tenderness, or signs of infection (pain, swelling, redness, odor or green/yellow discharge around incision site)  As directed     Call MD for:  severe uncontrolled pain  As directed     Call MD for:  temperature >100.4  As directed     Diet - low sodium heart healthy  As directed     Discharge instructions  As directed     Comments:      Call [phone] for a followup appointment. Take a stool softener while you are using pain medications.    Driving Restrictions  As  directed     Comments:      Do not drive for 2 weeks.    Increase activity slowly  As directed     Lifting restrictions  As directed     Comments:      Do not lift more than 5 pounds. No excessive bending or twisting.    May shower / Bathe  As directed     Comments:      He may shower after the pain she is removed 3 days after surgery. Leave the incision alone.    Remove dressing in 48 hours  As directed     Comments:      Your stitches are under the scan and will dissolve by themselves. The Steri-Strips will fall off after you take a few showers. Do not rub back or pick at the wound, Leave the wound alone.        Medication List    STOP taking these medications       predniSONE 20 MG tablet  Commonly known as:  DELTASONE      TAKE these medications       diazepam 5 MG tablet  Commonly known as:  VALIUM  Take 1 tablet (5 mg total) by mouth every 6 (six) hours as needed.     DSS 100 MG Caps  Take 100 mg by mouth 2 (two) times daily.     multivitamin with minerals Tabs  Take 1  tablet by mouth daily.     oxyCODONE-acetaminophen 5-325 MG per tablet  Commonly known as:  PERCOCET/ROXICET  Take 1-2 tablets by mouth every 4 (four) hours as needed.     tadalafil 20 MG tablet  Commonly known as:  CIALIS  Take 1/2 to 1 tablet prior to intercourse         Signed: Cristi Loron 07/25/2012, 4:37 PM

## 2012-07-25 NOTE — Progress Notes (Signed)
Subjective:  The patient is alert and pleasant. He looks well. His leg feels better.  Objective: Vital signs in last 24 hours: Temp:  [97.8 F (36.6 C)-98.1 F (36.7 C)] 97.9 F (36.6 C) (05/08 1220) Pulse Rate:  [56-67] 56 (05/08 1241) Resp:  [11-20] 13 (05/08 1241) BP: (124-161)/(61-72) 124/66 mmHg (05/08 1234) SpO2:  [95 %-98 %] 98 % (05/08 1241) Weight:  [96.888 kg (213 lb 9.6 oz)] 96.888 kg (213 lb 9.6 oz) (05/07 1412)  Intake/Output from previous day:   Intake/Output this shift: Total I/O In: 1400 [I.V.:1400] Out: 500 [Urine:450; Blood:50]  Physical exam  Patient is alert and oriented. He is moving his lower strumming as well.  Lab Results:  Recent Labs  07/24/12 1430  WBC 7.7  HGB 13.4  HCT 37.8*  PLT 293   BMET  Recent Labs  07/24/12 1430  NA 138  K 4.2  CL 101  CO2 30  GLUCOSE 83  BUN 22  CREATININE 1.15  CALCIUM 9.6    Studies/Results: Dg Lumbar Spine 1 View  07/25/2012  *RADIOLOGY REPORT*  Clinical Data: Lumbar disc protrusion.  LUMBAR SPINE - 1 VIEW  Comparison: MRI dated 07/07/2012  Findings: There is an instrument at the level of the pedicles of L5.  IMPRESSION: Instrument at L5.   Original Report Authenticated By: Francene Boyers, M.D.     Assessment/Plan: The patient is doing well.  LOS: 0 days     Rakeb Kibble D 07/25/2012, 12:50 PM

## 2012-07-25 NOTE — Anesthesia Preprocedure Evaluation (Signed)
Anesthesia Evaluation  Patient identified by MRN, date of birth, ID band Patient awake    Reviewed: Allergy & Precautions, H&P , NPO status , Patient's Chart, lab work & pertinent test results  Airway Mallampati: II  Neck ROM: full    Dental   Pulmonary sleep apnea ,          Cardiovascular     Neuro/Psych    GI/Hepatic   Endo/Other    Renal/GU      Musculoskeletal   Abdominal   Peds  Hematology   Anesthesia Other Findings   Reproductive/Obstetrics                           Anesthesia Physical Anesthesia Plan  ASA: II  Anesthesia Plan: General   Post-op Pain Management:    Induction: Intravenous  Airway Management Planned: Oral ETT  Additional Equipment:   Intra-op Plan:   Post-operative Plan: Extubation in OR  Informed Consent: I have reviewed the patients History and Physical, chart, labs and discussed the procedure including the risks, benefits and alternatives for the proposed anesthesia with the patient or authorized representative who has indicated his/her understanding and acceptance.     Plan Discussed with: CRNA, Anesthesiologist and Surgeon  Anesthesia Plan Comments:         Anesthesia Quick Evaluation

## 2012-07-25 NOTE — Op Note (Signed)
Brief history: The patient is a 42 year old white male who's had some chronic back and leg pain. Recently has gotten worse. He failed medical management and was worked up with a lumbar MRI which demonstrated a herniated disc at L5-S1 on the left. I discussed the various treatment options with the patient including surgery. The patient has weighed the risks, benefits, and alternatives surgery and decided proceed with a left L5-S1 discectomy.  Preoperative diagnosis: Left L5-S1 herniated disc, lumbago, lumbar radiculopathy  Postoperative diagnosis: The same  Procedure: L5-S1 Intervertebral discectomy using micro-dissection  Surgeon: Dr. Delma Officer  Asst.: Dr. Maeola Harman  Anesthesia: Gen. endotracheal  Estimated blood loss: Minimal  Drains: None  Complications: None  Description of procedure: The patient was brought to the operating room by the anesthesia team. General endotracheal anesthesia was induced. The patient was turned to the prone position on the Wilson frame. The patient's lumbosacral region was then prepared with Betadine scrub and Betadine solution. Sterile drapes were applied.  I then injected the area to be incised with Marcaine with epinephrine solution. I then used a scalpel to make a linear midline incision over the L5-S1 intervertebral disc space. I then used electrocautery to perform a left sided subperiosteal dissection exposing the spinous process and lamina of L4, L5 and the upper sacrum. We obtained intraoperative radiograph to confirm our location. I then inserted the [facility] retractor for exposure.  We then brought the operative microscope into the field. Under its magnification and illumination we completed the microdissection. I used a high-speed drill to perform a laminotomy at L5 on the left. I then used a Kerrison punches to widen the laminotomy and removed the ligamentum flavum at L5-S1 on the left. We then used microdissection to free up the thecal sac and  the left S1 nerve root from the epidural tissue. I then used a Kerrison punch to perform a foraminotomy at about the left S1 nerve root. We then using the nerve root retractor to gently retract the thecal sac and the left S1 nerve root medially. This exposed the intervertebral disc. We identified the ruptured disc and remove it with the pituitary forceps. I inspected the intervertebral disc at L5-S1. There was a small hole in the annulus but did not appear to be any impending herniations. We therefore did not perform intervertebral discectomy.  I then palpated along the ventral surface of the thecal sac and along exit route of the left S1 nerve root and noted that the neural structures were well decompressed. This completed the decompression.  We then obtained hemostasis using bipolar electrocautery. We irrigated the wound out with bacitracin solution. We then removed the retractor. We then reapproximated the patient's thoracolumbar fascia with interrupted #1 Vicryl suture. We then reapproximated the patient's subcutaneous tissue with interrupted 3-0 Vicryl suture. We then reapproximated patient's skin with Steri-Strips and benzoin. The was then coated with bacitracin ointment. The drapes were removed. The patient was subsequently returned to the supine position where they were extubated by the anesthesia team. The patient was then transported to the postanesthesia care unit in stable condition. All sponge instrument and needle counts were reportedly correct at the end of this case.

## 2012-07-25 NOTE — Progress Notes (Signed)
Lunch relief by A. Anderson RN 

## 2012-07-25 NOTE — H&P (Signed)
Subjective: The patient is a 42 year old black male who has had chronic back and leg pain. More recently it has increased. He was worked up with a lumbar MRI. This demonstrated a herniated disc at L5-S1 on the left. I discussed the various treatment options with the patient including surgery. The patient has weighed the risks, benefits, and alternatives surgery and decided proceed with a left L5-S1 discectomy.   Past Medical History  Diagnosis Date  . Sleep apnea     CPAP(doesn't use);sleep study done 3+yrs ago  . Back pain     herniated disc radiculopathy and lumbago  . Eczema     around nose occasionally    Past Surgical History  Procedure Laterality Date  . Appendectomy  75yrs ago  . Right ear surgery  at age 64  . Colonoscopy      Not on File  History  Substance Use Topics  . Smoking status: Never Smoker   . Smokeless tobacco: Not on file  . Alcohol Use: Yes     Comment: occasionally    No family history on file. Prior to Admission medications   Medication Sig Start Date End Date Taking? Authorizing Provider  Multiple Vitamin (MULTIVITAMIN WITH MINERALS) TABS Take 1 tablet by mouth daily.   Yes Historical Provider, MD  predniSONE (DELTASONE) 20 MG tablet Take 20 mg by mouth daily. 6 tabs, 5 tabs, 4 tabs, 3 tabs, 2 tabs, 1 tab, then took 1 tab qd for 3 or 4 days, then stopped.    Historical Provider, MD  tadalafil (CIALIS) 20 MG tablet Take 1/2 to 1 tablet prior to intercourse 03/22/12   Collene Gobble, MD     Review of Systems  Positive ROS: As above  All other systems have been reviewed and were otherwise negative with the exception of those mentioned in the HPI and as above.  Objective: Vital signs in last 24 hours: Temp:  [97.8 F (36.6 C)-98.1 F (36.7 C)] 97.8 F (36.6 C) (05/08 0708) Pulse Rate:  [64-67] 67 (05/08 0708) Resp:  [20] 20 (05/08 0708) BP: (135-161)/(69-72) 135/69 mmHg (05/08 0708) SpO2:  [95 %-97 %] 97 % (05/08 0708) Weight:  [96.888 kg (213 lb  9.6 oz)] 96.888 kg (213 lb 9.6 oz) (05/07 1412)  General Appearance: Alert, cooperative, no distress, appears stated age Head: Normocephalic, without obvious abnormality, atraumatic Eyes: PERRL, conjunctiva/corneas clear, EOM's intact, fundi benign, both eyes      Ears: Normal TM's and external ear canals, both ears Throat: Lips, mucosa, and tongue normal; teeth and gums normal Neck: Supple, symmetrical, trachea midline, no adenopathy; thyroid: No enlargement/tenderness/nodules; no carotid bruit or JVD Back: Symmetric, no curvature, ROM normal, no CVA tenderness Lungs: Clear to auscultation bilaterally, respirations unlabored Heart: Regular rate and rhythm, S1 and S2 normal, no murmur, rub or gallop Abdomen: Soft, non-tender, bowel sounds active all four quadrants, no masses, no organomegaly Extremities: Extremities normal, atraumatic, no cyanosis or edema Pulses: 2+ and symmetric all extremities Skin: Skin color, texture, turgor normal, no rashes or lesions  NEUROLOGIC:   Mental status: alert and oriented, no aphasia, good attention span, Fund of knowledge/ memory ok Motor Exam - grossly normal Sensory Exam - grossly normal Reflexes:  Coordination - grossly normal Gait - grossly normal Balance - grossly normal Cranial Nerves: I: smell Not tested  II: visual acuity  OS: Normal    OD: Normal   II: visual fields Full to confrontation  II: pupils Equal, round, reactive to light  III,VII: ptosis  None  III,IV,VI: extraocular muscles  Full ROM  V: mastication Normal  V: facial light touch sensation  Normal  V,VII: corneal reflex  Present  VII: facial muscle function - upper  Normal  VII: facial muscle function - lower Normal  VIII: hearing Not tested  IX: soft palate elevation  Normal  IX,X: gag reflex Present  XI: trapezius strength  5/5  XI: sternocleidomastoid strength 5/5  XI: neck flexion strength  5/5  XII: tongue strength  Normal    Data Review Lab Results  Component  Value Date   WBC 7.7 07/24/2012   HGB 13.4 07/24/2012   HCT 37.8* 07/24/2012   MCV 86.1 07/24/2012   PLT 293 07/24/2012   Lab Results  Component Value Date   NA 138 07/24/2012   K 4.2 07/24/2012   CL 101 07/24/2012   CO2 30 07/24/2012   BUN 22 07/24/2012   CREATININE 1.15 07/24/2012   GLUCOSE 83 07/24/2012   No results found for this basename: INR, PROTIME    Assessment/Plan: Left L5-S1 herniated disc, lumbago, lumbar radiculopathy: I discussed the situation with the patient. I have reviewed his MR scan with them and pointed out the abnormalities. We have discussed the various treatment options including surgery. I described the surgical option of a left L5-S1 discectomy. I described the surgery to him. I've shown him surgical models. We have discussed the risks, benefits, alternatives, and likelihood of achieving our goals with surgery. I have answered all the patient's questions. He has decided to proceed with surgery.   Breelle Hollywood D 07/25/2012 9:46 AM

## 2012-07-25 NOTE — Plan of Care (Signed)
Problem: Consults Goal: Diagnosis - Spinal Surgery Outcome: Completed/Met Date Met:  07/25/12 Microdiscectomy

## 2012-07-25 NOTE — Preoperative (Signed)
Beta Blockers   Reason not to administer Beta Blockers:Not Applicable 

## 2012-07-26 ENCOUNTER — Encounter (HOSPITAL_COMMUNITY): Payer: Self-pay | Admitting: Neurosurgery

## 2012-09-29 ENCOUNTER — Other Ambulatory Visit: Payer: Self-pay | Admitting: Emergency Medicine

## 2012-10-14 ENCOUNTER — Telehealth: Payer: Self-pay | Admitting: *Deleted

## 2012-10-14 MED ORDER — TADALAFIL 20 MG PO TABS: ORAL_TABLET | ORAL | Status: DC

## 2012-10-14 NOTE — Telephone Encounter (Signed)
Per Dr. Cleta Alberts rx sent in for Cialis

## 2013-01-23 ENCOUNTER — Other Ambulatory Visit: Payer: Self-pay

## 2013-01-27 ENCOUNTER — Other Ambulatory Visit: Payer: Self-pay | Admitting: Emergency Medicine

## 2013-02-04 ENCOUNTER — Other Ambulatory Visit: Payer: Self-pay | Admitting: Emergency Medicine

## 2013-02-04 ENCOUNTER — Other Ambulatory Visit: Payer: Self-pay | Admitting: Family Medicine

## 2013-02-04 DIAGNOSIS — N529 Male erectile dysfunction, unspecified: Secondary | ICD-10-CM

## 2013-02-04 MED ORDER — TADALAFIL 20 MG PO TABS: ORAL_TABLET | ORAL | Status: DC

## 2013-03-04 ENCOUNTER — Other Ambulatory Visit: Payer: Self-pay | Admitting: Emergency Medicine

## 2013-03-04 ENCOUNTER — Telehealth: Payer: Self-pay | Admitting: Family Medicine

## 2013-03-04 ENCOUNTER — Telehealth: Payer: Self-pay | Admitting: Emergency Medicine

## 2013-03-04 DIAGNOSIS — F988 Other specified behavioral and emotional disorders with onset usually occurring in childhood and adolescence: Secondary | ICD-10-CM

## 2013-03-04 DIAGNOSIS — F439 Reaction to severe stress, unspecified: Secondary | ICD-10-CM

## 2013-03-04 MED ORDER — DIAZEPAM 5 MG PO TABS: 5.0000 mg | ORAL_TABLET | Freq: Four times a day (QID) | ORAL | Status: DC | PRN

## 2013-03-04 MED ORDER — LISDEXAMFETAMINE DIMESYLATE 50 MG PO CAPS: 50.0000 mg | ORAL_CAPSULE | Freq: Every day | ORAL | Status: DC

## 2013-03-04 NOTE — Telephone Encounter (Signed)
Moderate seeded cough and the patient that he needed a refill on his Valium he takes for stress as well as a refill on his Vyvanse he has taken for ADD. He's been off his medications for 2 years. He tried to get an appointment but is not able to get in until January. He was given prescriptions for each of these medications until he can get in to

## 2013-03-04 NOTE — Telephone Encounter (Signed)
Spoke with  Patient and let him know that prescription for Vailum was ready [facility].  He is going to schedule an appointment for Vyvanse prescription.  [facility].

## 2013-04-15 ENCOUNTER — Encounter: Payer: Self-pay | Admitting: Emergency Medicine

## 2013-04-15 ENCOUNTER — Ambulatory Visit (INDEPENDENT_AMBULATORY_CARE_PROVIDER_SITE_OTHER): Payer: 59 | Admitting: Emergency Medicine

## 2013-04-15 VITALS — BP 124/54 | HR 50 | Temp 98.9°F | Resp 16 | Ht 71.25 in | Wt 209.6 lb

## 2013-04-15 DIAGNOSIS — N529 Male erectile dysfunction, unspecified: Secondary | ICD-10-CM

## 2013-04-15 DIAGNOSIS — F988 Other specified behavioral and emotional disorders with onset usually occurring in childhood and adolescence: Secondary | ICD-10-CM | POA: Insufficient documentation
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
## 2013-05-22 ENCOUNTER — Ambulatory Visit (INDEPENDENT_AMBULATORY_CARE_PROVIDER_SITE_OTHER): Payer: 59 | Admitting: Family Medicine

## 2013-05-22 VITALS — BP 118/68 | HR 60 | Temp 98.4°F | Resp 16 | Ht 71.0 in | Wt 207.2 lb

## 2013-05-22 DIAGNOSIS — J0100 Acute maxillary sinusitis, unspecified: Secondary | ICD-10-CM

## 2013-05-22 MED ORDER — AMOXICILLIN-POT CLAVULANATE 875-125 MG PO TABS: 1.0000 | ORAL_TABLET | Freq: Two times a day (BID) | ORAL | Status: DC

## 2013-05-22 MED ORDER — IPRATROPIUM BROMIDE 0.03 % NA SOLN: 2.0000 | Freq: Two times a day (BID) | NASAL | Status: DC

## 2013-05-22 NOTE — Progress Notes (Signed)
Subjective:    Patient ID: Martin Velazquez, male    DOB: 02/11/1971, 43 y.o.   MRN: [id number]  HPI This chart was scribed for Karesa Maultsby-MD by Smiley Houseman, Scribe. This patient was seen in room 10 and the patient's care was started at 5:31 PM.  HPI Comments: Martin Velazquez is a 43 y.o. male who presents to the [facility] complaining of constant worsening nasal congestion that started about 2 weeks ago.  Pt has an associated faint HA, sore throat, rhinorrhea, slight sinus pressure, and non productive cough.  Pt states his cough is due to his throat being irritated.  Pt states his sinuses are productive of yellowish mucous.  Pt is unsure of fever, but states he has been waking up in sweats.  Pt denies otalgia.  Pt tried an old azithromycin without relief, but denies trying other OTC medications.  Pt has h/o walking pneumonia.  Pt denies receiving the flu shot.    PCP-Dr. Cleta Alberts   Past Medical History  Diagnosis Date  . Sleep apnea     CPAP(doesn't use);sleep study done 3+yrs ago  . Back pain     herniated disc radiculopathy and lumbago  . Eczema     around nose occasionally    Past Surgical History  Procedure Laterality Date  . Appendectomy  71yrs ago  . Right ear surgery  at age 6  . Colonoscopy    . Lumbar laminectomy/decompression microdiscectomy N/A 07/25/2012    Procedure: Lumbar five-sacral one diskectomy;  Surgeon: Cristi Loron, MD;  Location: [facility];  Service: Neurosurgery;  Laterality: N/A;  Lumbar five-sacral one diskectomy    History reviewed. No pertinent family history.  History   Social History  . Marital Status: Married    Spouse Name: N/A    Number of Children: N/A  . Years of Education: N/A   Occupational History  . Not on file.   Social History Main Topics  . Smoking status: Never Smoker   . Smokeless tobacco: Not on file  . Alcohol Use: Yes     Comment: occasionally  . Drug Use: No  . Sexual Activity: Yes   Other  Topics Concern  . Not on file   Social History Narrative  . No narrative on file    No Known Allergies  Patient Active Problem List   Diagnosis Date Noted  . Erectile dysfunction 04/15/2013  . ADD (attention deficit disorder) 04/15/2013     Review of Systems  Constitutional: Negative for fever and chills.  HENT: Positive for congestion (nasal), rhinorrhea, sinus pressure and sore throat. Negative for ear pain.   Respiratory: Positive for cough. Negative for shortness of breath and wheezing.   Cardiovascular: Negative for chest pain.  Gastrointestinal: Negative for nausea, vomiting, abdominal pain and diarrhea.  Musculoskeletal: Negative for back pain.  Skin: Negative for color change and rash.  Neurological: Positive for headaches. Negative for syncope.  Psychiatric/Behavioral: Negative for behavioral problems and confusion.       Objective:   Physical Exam  Nursing note and vitals reviewed. Constitutional: He is oriented to person, place, and time. He appears well-developed and well-nourished. No distress.  HENT:  Head: Normocephalic and atraumatic.  Right Ear: Tympanic membrane, external ear and ear canal normal.  Left Ear: Tympanic membrane, external ear and ear canal normal.  Nose: Right sinus exhibits maxillary sinus tenderness and frontal sinus tenderness. Left sinus exhibits maxillary sinus tenderness and frontal sinus tenderness.  Mouth/Throat:  Uvula is midline, oropharynx is clear and moist and mucous membranes are normal. No oropharyngeal exudate, posterior oropharyngeal edema or posterior oropharyngeal erythema.  Drainage present in nose and pharynx.    Eyes: Conjunctivae and EOM are normal. Right eye exhibits no discharge. Left eye exhibits no discharge.  Neck: Neck supple. No tracheal deviation present. No thyromegaly present.  Cardiovascular: Normal rate, regular rhythm and normal heart sounds.  Exam reveals no gallop and no friction rub.   No murmur  heard. Pulmonary/Chest: Effort normal and breath sounds normal. No respiratory distress. He has no wheezes. He has no rales.  Musculoskeletal: Normal range of motion.  Lymphadenopathy:    He has no cervical adenopathy.  Neurological: He is alert and oriented to person, place, and time.  Skin: Skin is warm and dry. No rash noted.  Psychiatric: He has a normal mood and affect. His behavior is normal. Judgment and thought content normal.    Filed Vitals:   05/22/13 1650  BP: 118/68  Pulse: 60  Temp: 98.4 F (36.9 C)  TempSrc: Oral  Resp: 16  Height: 5\' 11"  (1.803 m)  Weight: 207 lb 3.2 oz (93.985 kg)  SpO2: 95%       Assessment & Plan:  Acute maxillary sinusitis   Meds ordered this encounter  Medications  . amoxicillin-clavulanate (AUGMENTIN) 875-125 MG per tablet    Sig: Take 1 tablet by mouth 2 (two) times daily.    Dispense:  20 tablet    Refill:  0  . ipratropium (ATROVENT) 0.03 % nasal spray    Sig: Place 2 sprays into the nose 2 (two) times daily.    Dispense:  30 mL    Refill:  0    I personally performed the services described in this documentation, which was scribed in my presence.  The recorded information has been reviewed and is accurate.  Nilda SimmerKristi Chantelle Verdi, M.D.  [facility]  [place] [address] [place], [place]  [id number] [phone] phone [phone] fax

## 2013-05-22 NOTE — Patient Instructions (Signed)

## 2014-01-08 ENCOUNTER — Telehealth: Payer: Self-pay

## 2014-01-08 MED ORDER — SILDENAFIL CITRATE 20 MG PO TABS: ORAL_TABLET | ORAL | Status: DC

## 2014-01-08 NOTE — Telephone Encounter (Signed)
Call patient I have sent prescription in

## 2014-01-08 NOTE — Telephone Encounter (Signed)
Pharmacist called to request a Rx for generic sildenafil 20 mg for pt. They are currently having a special price for it and the pt wants to try it instead of the Cialis d/t cost savings. Dr Cleta Albertsaub, do you want to Rx? I advised pharmacist that you haven't seen pt since Jan and you may need to see pt first if you have any concerns about his health status. I have pended, but didn't change the sig or quantity.

## 2014-01-09 NOTE — Telephone Encounter (Signed)
Unable to LM on cell-VM is full [facility]

## 2014-01-09 NOTE — Telephone Encounter (Signed)
Patient returning call to our office. Per patient he wasn't sure who called him. I informed patient it was regarding his medication refill and it was authorized.

## 2014-04-04 ENCOUNTER — Other Ambulatory Visit: Payer: Self-pay | Admitting: Emergency Medicine

## 2014-04-06 NOTE — Telephone Encounter (Signed)
Dr Cleta Albertsaub, pt hasn't been seen by you since last Jan. I have pended RF w/note to RTC.

## 2014-12-22 ENCOUNTER — Encounter: Payer: Self-pay | Admitting: Emergency Medicine

## 2015-01-07 ENCOUNTER — Telehealth: Payer: Self-pay

## 2015-01-07 NOTE — Telephone Encounter (Signed)
Pt request Cialis for refill  Pharm: [email or web address]  [phone]

## 2015-01-08 ENCOUNTER — Other Ambulatory Visit: Payer: Self-pay | Admitting: Physician Assistant

## 2015-01-08 DIAGNOSIS — N529 Male erectile dysfunction, unspecified: Secondary | ICD-10-CM

## 2015-01-08 MED ORDER — TADALAFIL 20 MG PO TABS: ORAL_TABLET | ORAL | Status: DC

## 2015-01-08 NOTE — Telephone Encounter (Signed)
This was sent today.

## 2015-05-24 ENCOUNTER — Ambulatory Visit (INDEPENDENT_AMBULATORY_CARE_PROVIDER_SITE_OTHER): Payer: 59 | Admitting: Physician Assistant

## 2015-05-24 VITALS — BP 122/64 | HR 54 | Temp 97.8°F | Resp 17 | Ht 72.0 in | Wt 210.0 lb

## 2015-05-24 DIAGNOSIS — J069 Acute upper respiratory infection, unspecified: Secondary | ICD-10-CM | POA: Diagnosis not present

## 2015-05-24 DIAGNOSIS — B9789 Other viral agents as the cause of diseases classified elsewhere: Principal | ICD-10-CM

## 2015-05-24 MED ORDER — HYDROCOD POLST-CPM POLST ER 10-8 MG/5ML PO SUER: 5.0000 mL | Freq: Two times a day (BID) | ORAL | Status: DC | PRN

## 2015-05-24 MED ORDER — BENZONATATE 100 MG PO CAPS: 100.0000 mg | ORAL_CAPSULE | Freq: Three times a day (TID) | ORAL | Status: DC | PRN

## 2015-05-24 NOTE — Patient Instructions (Signed)
Drink plenty of water (64 oz/day) and get plenty of rest. If you have been prescribed a cough syrup, do not drive or operate heavy machinery while using this medication. Take tessalon three times during the day. Take it easy the next couple days, no work and no exercise. May return to work on Wednesday  If your symptoms are not improving by the weekend, call and let me know. I would consider prescribing an antibiotic.

## 2015-05-24 NOTE — Progress Notes (Signed)
[facility] [address] [place] [address] [phone]  Date:  05/24/2015   Name:  Martin Velazquez   DOB:  1970-09-23   MRN:  [id number]  PCP:  Lucilla Edin, MD    Chief Complaint: URI; Shortness of Breath; and Nasal Congestion   History of Present Illness:  This is a 45 y.o. male with PMH ED and ADD who is presenting with nasal congestion x 4-5 days. Started with chills and sweats. Feeling generally ok if he is not active. He has been trying to work out like normal and states he feels terrible and can't do his normal work outs due to his breathing. 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 He has worked out every single day since getting sick. He is worried he has walking pneumonia which he has had in the past. +cough, productive Denies wheezing, sore throat, fever.  Aggravating/alleviating factors: nyquil once. Took mucinex a few times. History of asthma: no History of env allergies: no Tobacco use: no  Review of Systems:  Review of Systems See HPI  Patient Active Problem List   Diagnosis Date Noted  . Erectile dysfunction 04/15/2013  . ADD (attention deficit disorder) 04/15/2013   Home meds: None  No Known Allergies  Past Surgical History  Procedure Laterality Date  . Appendectomy  33yrs ago  . Right ear surgery  at age 32  . Colonoscopy    . Lumbar laminectomy/decompression microdiscectomy N/A 07/25/2012    Procedure: Lumbar five-sacral one diskectomy;  Surgeon: Cristi Loron, MD;  Location: [facility];  Service: Neurosurgery;  Laterality: N/A;  Lumbar five-sacral one diskectomy    Social History  Substance Use Topics  . Smoking status: Never Smoker   . Smokeless tobacco: None  . Alcohol Use: Yes     Comment: occasionally    History reviewed. No pertinent family history.  Medication list has been reviewed and updated.  Physical Examination:  Physical Exam  Constitutional: He is oriented to person, place, and time. He  appears well-developed and well-nourished. No distress.  HENT:  Head: Normocephalic and atraumatic.  Right Ear: Hearing, tympanic membrane, external ear and ear canal normal.  Left Ear: Hearing, tympanic membrane, external ear and ear canal normal.  Nose: Nose normal. Right sinus exhibits no maxillary sinus tenderness and no frontal sinus tenderness. Left sinus exhibits no maxillary sinus tenderness and no frontal sinus tenderness.  Mouth/Throat: Uvula is midline, oropharynx is clear and moist and mucous membranes are normal.  Eyes: Conjunctivae and lids are normal. Right eye exhibits no discharge. Left eye exhibits no discharge. No scleral icterus.  Cardiovascular: Normal rate, regular rhythm, normal heart sounds and normal pulses.   No murmur heard. Pulmonary/Chest: Effort normal and breath sounds normal. No respiratory distress. He has no wheezes. He has no rhonchi. He has no rales.  Musculoskeletal: Normal range of motion.  Lymphadenopathy:       Head (right side): No submental, no submandibular and no tonsillar adenopathy present.       Head (left side): No submental, no submandibular and no tonsillar adenopathy present.    He has no cervical adenopathy.  Neurological: He is alert and oriented to person, place, and time.  Skin: Skin is warm, dry and intact. No lesion and no rash noted.  Psychiatric: He has a normal mood and affect. His speech is normal and behavior is normal. Thought content normal.   BP 122/64 mmHg  Pulse 54  Temp(Src) 97.8 F (36.6 C) (Oral)  Resp 17  Ht 6' (1.829 m)  Wt 210 lb (95.255 kg)  BMI 28.47 kg/m2  SpO2 98%  Assessment and Plan:  1. Viral URI with cough Vitals normal, exam benign. I advised patient not work or exercise for the next couple days. Take tessalon and tussionex for cough. He will let me know if not getting better in 1 week - I would consider sending in abx d/t hx walking pneumonia. - chlorpheniramine-HYDROcodone (TUSSIONEX PENNKINETIC ER)  10-8 MG/5ML SUER; Take 5 mLs by mouth every 12 (twelve) hours as needed for cough.  Dispense: 100 mL; Refill: 0 - benzonatate (TESSALON) 100 MG capsule; Take 1-2 capsules (100-200 mg total) by mouth 3 (three) times daily as needed for cough.  Dispense: 40 capsule; Refill: 0   Roswell MinersNicole V. Dyke BrackettBush, PA-C, MHS [facility]  05/24/2015

## 2015-05-27 ENCOUNTER — Telehealth: Payer: Self-pay

## 2015-05-27 DIAGNOSIS — J209 Acute bronchitis, unspecified: Secondary | ICD-10-CM

## 2015-05-27 NOTE — Telephone Encounter (Signed)
Pt states symp. Have not improved and would like the antibiotic that was previously discussed [facility] [address]   Best number [phone] Pt req. Call back from N.Danae OrleansBush

## 2015-05-28 MED ORDER — AZITHROMYCIN 250 MG PO TABS: ORAL_TABLET | ORAL | Status: AC

## 2015-05-28 NOTE — Telephone Encounter (Signed)
Sent in zpak

## 2015-05-28 NOTE — Telephone Encounter (Signed)
Patient Instructions     Drink plenty of water (64 oz/day) and get plenty of rest. If you have been prescribed a cough syrup, do not drive or operate heavy machinery while using this medication. Take tessalon three times during the day. Take it easy the next couple days, no work and no exercise. May return to work on Wednesday  If your symptoms are not improving by the weekend, call and let me know. I would consider prescribing an antibiotic.

## 2015-05-31 NOTE — Telephone Encounter (Signed)
Spoke with patient and he had picked up the Zpack 3 days ago.  He stated that he was feeling much better.  i did recommend that he drink plenty of water and call us if he needed anything else or if symptoms became worse./

## 2015-07-20 ENCOUNTER — Telehealth: Payer: Self-pay | Admitting: Emergency Medicine

## 2015-07-20 ENCOUNTER — Other Ambulatory Visit: Payer: Self-pay | Admitting: Emergency Medicine

## 2015-07-20 DIAGNOSIS — H60393 Other infective otitis externa, bilateral: Secondary | ICD-10-CM

## 2015-07-20 MED ORDER — OFLOXACIN 0.3 % OT SOLN: 10.0000 [drp] | Freq: Every day | OTIC | Status: DC

## 2015-07-20 NOTE — Telephone Encounter (Signed)
Phone call. Patient has swimmers ear and needs meds.Floxin otic called in.

## 2015-07-22 ENCOUNTER — Other Ambulatory Visit: Payer: Self-pay | Admitting: Emergency Medicine

## 2015-07-22 DIAGNOSIS — N529 Male erectile dysfunction, unspecified: Secondary | ICD-10-CM

## 2015-07-22 MED ORDER — TADALAFIL 20 MG PO TABS: ORAL_TABLET | ORAL | Status: DC

## 2015-07-26 ENCOUNTER — Ambulatory Visit (INDEPENDENT_AMBULATORY_CARE_PROVIDER_SITE_OTHER): Payer: 59 | Admitting: Emergency Medicine

## 2015-07-26 VITALS — BP 118/72 | HR 53 | Temp 98.2°F | Resp 16 | Ht 72.0 in | Wt 212.0 lb

## 2015-07-26 DIAGNOSIS — H6992 Unspecified Eustachian tube disorder, left ear: Secondary | ICD-10-CM | POA: Diagnosis not present

## 2015-07-26 DIAGNOSIS — H60392 Other infective otitis externa, left ear: Secondary | ICD-10-CM | POA: Diagnosis not present

## 2015-07-26 MED ORDER — TADALAFIL 20 MG PO TABS: ORAL_TABLET | ORAL | Status: DC

## 2015-07-26 MED ORDER — FLUTICASONE PROPIONATE 50 MCG/ACT NA SUSP: 2.0000 | Freq: Every day | NASAL | Status: DC

## 2015-07-26 NOTE — Progress Notes (Signed)
Subjective:  This chart was scribed for  Lesle Chris MD, by Veverly Fells, at [facility].  This patient was seen in room 9 and the patient's care was started at 12:36 PM.   Chief Complaint  Patient presents with  . Ear Problem    Left ear, issues hearing/possible infection, comes and goes     Patient ID: Martin Velazquez, male    DOB: 29-Dec-1970, 45 y.o.   MRN: [id number]  HPI HPI Comments: Martin Velazquez is a 45 y.o. male who presents to the [facility] complaining of intermittent left ear discomfort which recently started again.  He feels like his hearing is normal at times and feels very different at random times as if there is a barrel over his ears. Patient was in [facility] recently and had an ear ache after swimming in the pool, which resolved after putting in drops.  When he went back a week later and dove into the pool, he began to have discomfort in his ear again.  He had pain initially when his symptoms began but now only has an abnormality in his hearing at random times.    Patient has had surgery on his right ear at the age of 51. Patient has no other questions or concerns today.    Patient Active Problem List   Diagnosis Date Noted  . Erectile dysfunction 04/15/2013  . ADD (attention deficit disorder) 04/15/2013   Past Medical History  Diagnosis Date  . Sleep apnea     CPAP(doesn't use);sleep study done 3+yrs ago  . Back pain     herniated disc radiculopathy and lumbago  . Eczema     around nose occasionally   Past Surgical History  Procedure Laterality Date  . Appendectomy  35yrs ago  . Right ear surgery  at age 53  . Colonoscopy    . Lumbar laminectomy/decompression microdiscectomy N/A 07/25/2012    Procedure: Lumbar five-sacral one diskectomy;  Surgeon: Cristi Loron, MD;  Location: [facility];  Service: Neurosurgery;  Laterality: N/A;  Lumbar five-sacral one diskectomy   No Known Allergies Prior to  Admission medications   Medication Sig Start Date End Date Taking? Authorizing Provider  Multiple Vitamin (MULTIVITAMIN WITH MINERALS) TABS Take 1 tablet by mouth daily. Reported on 07/26/2015   Yes Historical Provider, MD  benzonatate (TESSALON) 100 MG capsule Take 1-2 capsules (100-200 mg total) by mouth 3 (three) times daily as needed for cough. Patient not taking: Reported on 07/26/2015 05/24/15   Dorna Leitz, PA-C  chlorpheniramine-HYDROcodone [facility]) 10-8 MG/5ML SUER Take 5 mLs by mouth every 12 (twelve) hours as needed for cough. Patient not taking: Reported on 07/26/2015 05/24/15   Dorna Leitz, PA-C  lisdexamfetamine (VYVANSE) 50 MG capsule Take 1 capsule (50 mg total) by mouth daily. Patient not taking: Reported on 05/24/2015 03/04/13   Collene Gobble, MD  ofloxacin (FLOXIN OTIC) 0.3 % otic solution Place 10 drops into both ears daily. Patient not taking: Reported on 07/26/2015 07/20/15   Collene Gobble, MD  sildenafil (REVATIO) 20 MG tablet Take 1 tablet 1 hour prior to intercourse Patient not taking: Reported on 05/24/2015 01/08/14   Collene Gobble, MD  tadalafil (CIALIS) 20 MG tablet Take 1/2 to 1 tablet prior to intercourse Patient not taking: Reported on 05/24/2015 03/22/12   Collene Gobble, MD  tadalafil (CIALIS) 20 MG tablet Take 1/2 to 1 tab prior to intercourse, can  be taken every 48 hrs. PATIENT NEEDS OFFICE VISIT FOR ADDITIONAL REFILLS Patient not taking: Reported on 05/24/2015 04/06/14   Collene GobbleSteven A Maston Wight, MD  tadalafil (CIALIS) 20 MG tablet Take 1/2 to 1 tab prior to intercourse, can be taken every 48 hours Patient not taking: Reported on 07/26/2015 07/22/15   Collene GobbleSteven A Shatiqua Heroux, MD   Social History   Social History  . Marital Status: Married    Spouse Name: N/A  . Number of Children: N/A  . Years of Education: N/A   Occupational History  . Not on file.   Social History Main Topics  . Smoking status: Never Smoker   . Smokeless tobacco: Not on file  . Alcohol Use: Yes      Comment: occasionally  . Drug Use: No  . Sexual Activity: Yes   Other Topics Concern  . Not on file   Social History Narrative   Review of Systems  Constitutional: Negative for fever and chills.  HENT: Positive for hearing loss.   Eyes: Negative for pain, redness and itching.  Respiratory: Negative for cough, choking and shortness of breath.   Gastrointestinal: Negative for nausea and vomiting.  Musculoskeletal: Negative for neck pain and neck stiffness.  Skin: Negative for color change.  Neurological: Negative for seizures, syncope and speech difficulty.       Objective:   Physical Exam Filed Vitals:   07/26/15 1221  BP: 118/72  Pulse: 53  Temp: 98.2 F (36.8 C)  TempSrc: Oral  Resp: 16  Height: 6' (1.829 m)  Weight: 212 lb (96.163 kg)  SpO2: 96%    CONSTITUTIONAL: Well developed/well nourished HEAD: Normocephalic/atraumatic EYES: EOMI/PERRL,  ENMT: Mucous membranes moist, throat is normal. right ear is normal, left is has a whitish coat over the ear drum, the canal is not swollen, there is no pain with movement of the left earlobe,  NECK: supple no meningeal signs, no adenopathy.  SPINE/BACK:entire spine nontender CV: S1/S2 noted, no murmurs/rubs/gallops noted LUNGS: Lungs are clear to auscultation bilaterally, no apparent distress, clear.  NEURO: Pt is awake/alert/appropriate, moves all extremitiesx4.  No facial droop.   EXTREMITIES: pulses normal/equal, full ROM SKIN: warm, color normal PSYCH: no abnormalities of mood noted, alert and oriented to situation      Assessment & Plan:  There appear to be a film over the eardrum. I don't know if this was just resolving infection related to his medications. I chose not to do anything except treat his eustachian tube dysfunction and will take a peek at his ear in about 7-10 days.I personally performed the services described in this documentation, which was scribed in my presence. The recorded information has been  reviewed and is accurate.   Collene GobbleSteven A Aubrianna Orchard, MD

## 2015-07-26 NOTE — Patient Instructions (Signed)
     IF you received an x-ray today, you will receive an invoice from [facility]. Please contact [facility] at [phone] with questions or concerns regarding your invoice.   IF you received labwork today, you will receive an invoice from [facility]. Please contact [facility] at [phone] with questions or concerns regarding your invoice.   Our billing staff will not be able to assist you with questions regarding bills from these companies.  You will be contacted with the lab results as soon as they are available. The fastest way to get your results is to activate your My Chart account. Instructions are located on the last page of this paperwork. If you have not heard from us regarding the results in 2 weeks, please contact this office.      

## 2015-11-09 ENCOUNTER — Telehealth: Payer: Self-pay

## 2015-11-09 DIAGNOSIS — F439 Reaction to severe stress, unspecified: Secondary | ICD-10-CM

## 2015-11-09 DIAGNOSIS — F988 Other specified behavioral and emotional disorders with onset usually occurring in childhood and adolescence: Secondary | ICD-10-CM

## 2015-11-09 NOTE — Telephone Encounter (Signed)
Pt it needing a refill on vyvanse and valium  Best number [phone]

## 2015-11-11 NOTE — Telephone Encounter (Signed)
Dr Cleta Albertsaub, I pended both. It looks like you haven't Rxd the valium for pt since 2014. This was not originally routed anywhere, so I didn't see the message until pt called to check on it today. I will prioritize it.

## 2015-11-12 ENCOUNTER — Other Ambulatory Visit: Payer: Self-pay | Admitting: Emergency Medicine

## 2015-11-12 DIAGNOSIS — F988 Other specified behavioral and emotional disorders with onset usually occurring in childhood and adolescence: Secondary | ICD-10-CM

## 2015-11-12 MED ORDER — LISDEXAMFETAMINE DIMESYLATE 50 MG PO CAPS: 50.0000 mg | ORAL_CAPSULE | Freq: Every day | ORAL | 0 refills | Status: DC

## 2015-11-12 NOTE — Telephone Encounter (Signed)
I refilled the vyvance but will need to talk with him further about the valium

## 2015-11-13 NOTE — Telephone Encounter (Signed)
Patient notified that rx is ready for pickup and he will make appt to see Dr. Cleta Albertsaub about Valium

## 2016-03-20 HISTORY — PX: CERVICAL DISC ARTHROPLASTY: SHX587

## 2016-08-20 ENCOUNTER — Other Ambulatory Visit: Payer: Self-pay | Admitting: Emergency Medicine

## 2016-08-26 DIAGNOSIS — M9901 Segmental and somatic dysfunction of cervical region: Secondary | ICD-10-CM | POA: Diagnosis not present

## 2016-08-26 DIAGNOSIS — M5032 Other cervical disc degeneration, mid-cervical region, unspecified level: Secondary | ICD-10-CM | POA: Diagnosis not present

## 2016-08-26 DIAGNOSIS — M531 Cervicobrachial syndrome: Secondary | ICD-10-CM | POA: Diagnosis not present

## 2016-08-29 ENCOUNTER — Ambulatory Visit (INDEPENDENT_AMBULATORY_CARE_PROVIDER_SITE_OTHER): Payer: Commercial Managed Care - HMO | Admitting: Student

## 2016-08-29 ENCOUNTER — Encounter: Payer: Self-pay | Admitting: Student

## 2016-08-29 ENCOUNTER — Ambulatory Visit (HOSPITAL_COMMUNITY)
Admission: RE | Admit: 2016-08-29 | Discharge: 2016-08-29 | Disposition: A | Discharge status: Home / Self Care | Payer: Commercial Managed Care - HMO | Source: Ambulatory Visit | Attending: Student | Admitting: Student

## 2016-08-29 VITALS — BP 142/66 | HR 62 | Temp 97.4°F | Resp 18 | Ht 70.59 in | Wt 206.6 lb

## 2016-08-29 DIAGNOSIS — M47892 Other spondylosis, cervical region: Secondary | ICD-10-CM | POA: Insufficient documentation

## 2016-08-29 DIAGNOSIS — M8938 Hypertrophy of bone, other site: Secondary | ICD-10-CM | POA: Diagnosis not present

## 2016-08-29 DIAGNOSIS — M5412 Radiculopathy, cervical region: Secondary | ICD-10-CM | POA: Insufficient documentation

## 2016-08-29 DIAGNOSIS — M50322 Other cervical disc degeneration at C5-C6 level: Secondary | ICD-10-CM | POA: Insufficient documentation

## 2016-08-29 DIAGNOSIS — M4802 Spinal stenosis, cervical region: Secondary | ICD-10-CM | POA: Diagnosis not present

## 2016-08-29 DIAGNOSIS — M542 Cervicalgia: Secondary | ICD-10-CM | POA: Diagnosis not present

## 2016-08-29 NOTE — Patient Instructions (Addendum)
Will get MRI and call with results.      IF you received an x-ray today, you will receive an invoice from [facility]. Please contact [facility] at [phone] with questions or concerns regarding your invoice.   IF you received labwork today, you will receive an invoice from [facility]. Please contact [facility] at [phone] with questions or concerns regarding your invoice.   Our billing staff will not be able to assist you with questions regarding bills from these companies.  You will be contacted with the lab results as soon as they are available. The fastest way to get your results is to activate your My Chart account. Instructions are located on the last page of this paperwork. If you have not heard from us regarding the results in 2 weeks, please contact this office.

## 2016-08-29 NOTE — Progress Notes (Signed)
  Edwin DadaKenneth E Val - 46 y.o. male MRN [id number]  Date of birth: 08/18/1970  SUBJECTIVE:  Including CC & ROS.  CC: neck pain with radicular symptoms  Presents with a month history of neck tightness and discomfort and worsening weakness of his left side.  He enjoys lifting weights and has noticed that he is having trouble with bench pressing with his left arm that has become worse over the past 2 weeks.  He is able to bench with his right, but his left is significantly weaker.  He has never had problems with his neck before.  He was seen by Ellamae SiaJohn O'Halloran, DPT, who did manipulation on him with relief of neck stiffness.  His weakness began a couple weeks later.  He was given prednisone without relief.  He notes numbness and tingling in dorsal hand and 1st and 2nd digit on left hand.  He saw a chiropractor on Saturday, who ordered x-rays that he states show OA at C7.   ROS: No unexpected weight loss, fever, chills, swelling, instability, muscle pain, numbness/tingling, redness, otherwise see HPI   PMHx - Updated and reviewed.  Contributory factors include: lumbar OA with h/o disc herniation, treated with surgery by Dr. Lovell SheehanJenkins PSHx - Updated and reviewed.  Contributory factors include:  lumbar surgery FHx - Updated and reviewed.  Contributory factors include:  Negative Social Hx - Updated and reviewed. Contributory factors include: Negative Medications - reviewed   DATA REVIEWED: MRI lumbar spine: L5-S1 disc degeneration and left lateral recess stenosis from disc protrusion  PHYSICAL EXAM:  VS: BP:(!) 142/66  HR:62bpm  TEMP:97.4 F (36.3 C)(Oral)  RESP:99 %  HT:5' 10.59" (179.3 cm)   WT:206 lb 9.6 oz (93.7 kg)  BMI:29.2 PHYSICAL EXAM: Gen: NAD, alert, cooperative with exam, well-appearing HEENT: clear conjunctiva,  CV:  no edema, capillary refill brisk, normal rate Resp: non-labored Skin: no rashes, normal turgor  Neuro: no gross deficits.  Psych:  alert and oriented Neck: Inspection  unremarkable. Limited ROM with flexion and sidebending L>R No palpable stepoffs. Negative Spurling's maneuver. Grip strength and sensation normal in bilateral hands Strength good C4 to T1 distribution No sensory change to C4 to T1 Negative Hoffman sign bilaterally Reflexes normal   ASSESSMENT & PLAN:   Radiculopathy of cervical spine Weakness of left arm, will get STAT MRI to check for nerve impingement. Has already done prednisone without relief. Cervical spine x-rays were done with chiropractor Has appt with Dr. Lovell SheehanJenkins, his neurosurgeon, on 6/26.  Will keep that appt and let pt know if needs sooner appt.  Signed,  Corliss MarcusAlicia Barnes, DO [facility] 4:51 PM 08/29/16

## 2016-08-29 NOTE — Assessment & Plan Note (Addendum)
Weakness of left arm, will get STAT MRI to check for nerve impingement. Has already done prednisone without relief. Cervical spine x-rays were done with chiropractor Has appt with Dr. Lovell SheehanJenkins, his neurosurgeon, on 6/26.  Will keep that appt and let pt know if needs sooner appt.

## 2016-08-30 ENCOUNTER — Telehealth: Payer: Self-pay | Admitting: Student

## 2016-08-30 ENCOUNTER — Telehealth: Payer: Self-pay | Admitting: Family Medicine

## 2016-08-30 NOTE — Telephone Encounter (Signed)
Called pt and verified name and DOB.  Reviewed MRI C-spine results- Moderate left neural foraminal stenosis and C5-6, C6-7 spinal stenosis.  Will call neurosurgery to see if he needs to be seen sooner.

## 2016-08-30 NOTE — Telephone Encounter (Signed)
Called for retro-auth for MRI performed on 08/29/16 at 6pm. No auth required per Rod[facility]Marie N[facility]/16 at 7:14 am (9:14 am for [facility])- Case number is [id number] and phone number is [phone]

## 2016-09-01 NOTE — Telephone Encounter (Signed)
Received call from neurosurgery- will see pt on Monday once confirmed by Dr. Lovell SheehanJenkins. They will notify pt.  Signed,  Corliss MarcusAlicia Barnes, DO [facility] 10:17 AM

## 2016-09-05 DIAGNOSIS — M5010 Cervical disc disorder with radiculopathy, unspecified cervical region: Secondary | ICD-10-CM | POA: Diagnosis not present

## 2016-09-08 DIAGNOSIS — M50123 Cervical disc disorder at C6-C7 level with radiculopathy: Secondary | ICD-10-CM | POA: Diagnosis not present

## 2016-09-08 DIAGNOSIS — M4722 Other spondylosis with radiculopathy, cervical region: Secondary | ICD-10-CM | POA: Diagnosis not present

## 2016-09-08 DIAGNOSIS — M50122 Cervical disc disorder at C5-C6 level with radiculopathy: Secondary | ICD-10-CM | POA: Diagnosis not present

## 2016-09-08 DIAGNOSIS — M542 Cervicalgia: Secondary | ICD-10-CM | POA: Diagnosis not present

## 2016-10-02 DIAGNOSIS — M50122 Cervical disc disorder at C5-C6 level with radiculopathy: Secondary | ICD-10-CM | POA: Diagnosis not present

## 2016-10-02 DIAGNOSIS — M50222 Other cervical disc displacement at C5-C6 level: Secondary | ICD-10-CM | POA: Diagnosis not present

## 2016-10-02 DIAGNOSIS — M50223 Other cervical disc displacement at C6-C7 level: Secondary | ICD-10-CM | POA: Diagnosis not present

## 2016-10-23 ENCOUNTER — Encounter: Payer: Self-pay | Admitting: Physician Assistant

## 2016-10-23 ENCOUNTER — Telehealth: Payer: Self-pay | Admitting: *Deleted

## 2016-10-23 ENCOUNTER — Ambulatory Visit (INDEPENDENT_AMBULATORY_CARE_PROVIDER_SITE_OTHER): Payer: Commercial Managed Care - HMO | Admitting: Physician Assistant

## 2016-10-23 VITALS — BP 134/73 | HR 52 | Temp 98.9°F | Resp 18 | Ht 70.59 in | Wt 210.8 lb

## 2016-10-23 DIAGNOSIS — H9202 Otalgia, left ear: Secondary | ICD-10-CM

## 2016-10-23 DIAGNOSIS — H60393 Other infective otitis externa, bilateral: Secondary | ICD-10-CM | POA: Diagnosis not present

## 2016-10-23 DIAGNOSIS — H6690 Otitis media, unspecified, unspecified ear: Secondary | ICD-10-CM | POA: Diagnosis not present

## 2016-10-23 DIAGNOSIS — N529 Male erectile dysfunction, unspecified: Secondary | ICD-10-CM

## 2016-10-23 MED ORDER — AMOXICILLIN-POT CLAVULANATE 875-125 MG PO TABS: 1.0000 | ORAL_TABLET | Freq: Two times a day (BID) | ORAL | 0 refills | Status: DC

## 2016-10-23 MED ORDER — AMOXICILLIN-POT CLAVULANATE 875-125 MG PO TABS: 1.0000 | ORAL_TABLET | Freq: Two times a day (BID) | ORAL | 0 refills | Status: AC

## 2016-10-23 MED ORDER — OFLOXACIN 0.3 % OT SOLN: 10.0000 [drp] | Freq: Every day | OTIC | 1 refills | Status: DC

## 2016-10-23 MED ORDER — TADALAFIL 20 MG PO TABS: ORAL_TABLET | ORAL | 3 refills | Status: DC

## 2016-10-23 NOTE — Progress Notes (Signed)
Martin Velazquez  MRN: [id number] DOB: 02/04/1971  PCP: Collene Gobbleaub, Steven A, MD  Subjective:  Pt is a 46 year old male PMH ADD, cervical radiculopathy who presents to clinic for left ear ache. He was at the beach this past weekend. Last night noticed left ear pain. Could not sleep due to pain. This morning he feels pain in his jaw. He has tried OTC ear drops, not helping.  Denies ear drainage, fever, chills, decreased hearing, tinnitis.   H/o erectile dysfunction - would like refill of Cialis. Denies headache, lightheadedness, syncope.   Review of Systems  Constitutional: Negative for chills and fever.  HENT: Positive for ear pain. Negative for congestion, ear discharge, postnasal drip, rhinorrhea, sinus pain, sinus pressure, sore throat, tinnitus and trouble swallowing.   Neurological: Negative for dizziness and headaches.  Psychiatric/Behavioral: Positive for sleep disturbance.    Patient Active Problem List   Diagnosis Date Noted  . Radiculopathy of cervical spine 08/29/2016  . Erectile dysfunction 04/15/2013  . ADD (attention deficit disorder) 04/15/2013    Current Outpatient Prescriptions on File Prior to Visit  Medication Sig Dispense Refill  . benzonatate (TESSALON) 100 MG capsule Take 1-2 capsules (100-200 mg total) by mouth 3 (three) times daily as needed for cough. (Patient not taking: Reported on 07/26/2015) 40 capsule 0  . chlorpheniramine-HYDROcodone (TUSSIONEX PENNKINETIC ER) 10-8 MG/5ML SUER Take 5 mLs by mouth every 12 (twelve) hours as needed for cough. (Patient not taking: Reported on 07/26/2015) 100 mL 0  . fluticasone (FLONASE) 50 MCG/ACT nasal spray Place 2 sprays into both nostrils daily. (Patient not taking: Reported on 08/29/2016) 16 g 6  . lisdexamfetamine (VYVANSE) 50 MG capsule Take 1 capsule (50 mg total) by mouth daily. 30 capsule 0  . Multiple Vitamin (MULTIVITAMIN WITH MINERALS) TABS Take 1 tablet by mouth daily. Reported on 07/26/2015    . ofloxacin (FLOXIN OTIC)  0.3 % otic solution Place 10 drops into both ears daily. (Patient not taking: Reported on 07/26/2015) 5 mL 1  . sildenafil (REVATIO) 20 MG tablet Take 1 tablet 1 hour prior to intercourse (Patient not taking: Reported on 05/24/2015) 50 tablet 0  . tadalafil (CIALIS) 20 MG tablet Take 1/2 to 1 tab prior to intercourse, can be taken every 48 hours (Patient not taking: Reported on 07/26/2015) 18 tablet 3  . tadalafil (CIALIS) 20 MG tablet Take 1/2 to 1 tablet prior to intercourse (Patient not taking: Reported on 10/23/2016) 6 tablet 11  . tadalafil (CIALIS) 20 MG tablet Take 1/2 to 1 tab prior to intercourse, can be taken every 48 hrs. 15 tablet 3   No current facility-administered medications on file prior to visit.     No Known Allergies   Objective:  BP 134/73 (BP Location: Right Arm, Patient Position: Sitting, Cuff Size: Large)   Pulse (!) 52   Temp 98.9 F (37.2 C) (Oral)   Resp 18   Ht 5' 10.59" (1.793 m)   Wt 210 lb 12.8 oz (95.6 kg)   SpO2 97%   BMI 29.74 kg/m   Physical Exam  Constitutional: He is oriented to person, place, and time and well-developed, well-nourished, and in no distress. No distress.  HENT:  Right Ear: Tympanic membrane normal.  Left Ear: There is tenderness (with tragal pressure). No drainage. No mastoid tenderness. Tympanic membrane is erythematous. A middle ear effusion is present. No decreased hearing is noted.  Cardiovascular: Normal rate, regular rhythm and normal heart sounds.   Neurological: He is alert and  oriented to person, place, and time. GCS score is 15.  Skin: Skin is warm and dry.  Psychiatric: Mood, memory, affect and judgment normal.  Vitals reviewed.   Assessment and Plan :  1. Otitis, externa, infective, bilateral - ofloxacin (FLOXIN OTIC) 0.3 % OTIC solution; Place 10 drops into both ears daily.  Dispense: 5 mL; Refill: 1 2. Acute otitis media, unspecified otitis media type 3. Left ear pain - amoxicillin-clavulanate (AUGMENTIN) 875-125 MG  tablet; Take 1 tablet by mouth 2 (two) times daily.  Dispense: 20 tablet; Refill: 0 - RTC in 3-5 days if no improvement.  4. Erectile dysfunction, unspecified erectile dysfunction type - tadalafil (CIALIS) 20 MG tablet; Take 1/2 to 1 tab prior to intercourse, can be taken every 48 hours  Dispense: 18 tablet; Refill: 3   Whitney Porchia Sinkler, PA-C  Primary [facility] at [facility] 10/23/2016 8:10 AM

## 2016-10-23 NOTE — Telephone Encounter (Signed)
Spoke with Lawanna KobusAngel at [facility] advised to cancel Amoxicillin and Ofloxacin Rx's.

## 2016-10-23 NOTE — Patient Instructions (Addendum)
See below for home care tips.  Come back in 3-5 days if you are not improving.   Thank you for coming in today. I hope you feel we met your needs.  Feel free to call PCP if you have any questions or further requests.  Please consider signing up for MyChart if you do not already have it, as this is a great way to communicate with me.  Best,  Whitney McVey, PA-C   Otitis Externa Otitis externa is an infection of the outer ear canal. The outer ear canal is the area between the outside of the ear and the eardrum. Otitis externa is sometimes called "swimmer's ear." What are the causes? This condition may be caused by:  Swimming in dirty water.  Moisture in the ear.  An injury to the inside of the ear.  An object stuck in the ear.  A cut or scrape on the outside of the ear.  What increases the risk? This condition is more likely to develop in swimmers. What are the signs or symptoms? The first symptom of this condition is often itching in the ear. Later signs and symptoms include:  Swelling of the ear.  Redness in the ear.  Ear pain. The pain may get worse when you pull on your ear.  Pus coming from the ear.  How is this diagnosed? This condition may be diagnosed by examining the ear and testing fluid from the ear for bacteria and funguses. How is this treated? This condition may be treated with:  Antibiotic ear drops. These are often given for 10-14 days.  Medicine to reduce itching and swelling.  Follow these instructions at home:  If you were prescribed antibiotic ear drops, apply them as told by your health care provider. Do not stop using the antibiotic even if your condition improves.  Take over-the-counter and prescription medicines only as told by your health care provider.  Keep all follow-up visits as told by your health care provider. This is important. How is this prevented?  Keep your ear dry. Use the corner of a towel to dry your ear after you swim or  bathe.  Avoid scratching or putting things in your ear. Doing these things can damage the ear canal or remove the protective wax that lines it, which makes it easier for bacteria and funguses to grow.  Avoid swimming in lakes, polluted water, or pools that may not have the right amount of chlorine.  Consider making ear drops and putting 3 or 4 drops in each ear after you swim. Ask your health care provider about how you can make ear drops. Contact a health care provider if:  You have a fever.  After 3 days your ear is still red, swollen, painful, or draining pus.  Your redness, swelling, or pain gets worse.  You have a severe headache.  You have redness, swelling, pain, or tenderness in the area behind your ear. This information is not intended to replace advice given to you by your health care provider. Make sure you discuss any questions you have with your health care provider. Document Released: 03/06/2005 Document Revised: 04/13/2015 Document Reviewed: 12/14/2014 Elsevier Interactive Patient Education  2018 [employer].   IF you received an x-ray today, you will receive an invoice from [facility]. Please contact [facility] at [phone] with questions or concerns regarding your invoice.   IF you received labwork today, you will receive an invoice from [facility]. Please contact [facility] at [phone] with questions or  concerns regarding your invoice.   Our billing staff will not be able to assist you with questions regarding bills from these companies.  You will be contacted with the lab results as soon as they are available. The fastest way to get your results is to activate your My Chart account. Instructions are located on the last page of this paperwork. If you have not heard from [place] regarding the results in 2 weeks, please contact this office.

## 2016-10-27 DIAGNOSIS — M5412 Radiculopathy, cervical region: Secondary | ICD-10-CM | POA: Diagnosis not present

## 2016-11-17 DIAGNOSIS — M5412 Radiculopathy, cervical region: Secondary | ICD-10-CM

## 2017-01-26 DIAGNOSIS — M5010 Cervical disc disorder with radiculopathy, unspecified cervical region: Secondary | ICD-10-CM | POA: Diagnosis not present

## 2017-01-26 DIAGNOSIS — R030 Elevated blood-pressure reading, without diagnosis of hypertension: Secondary | ICD-10-CM | POA: Diagnosis not present

## 2017-08-09 ENCOUNTER — Encounter: Payer: Self-pay | Admitting: Family Medicine

## 2017-10-19 ENCOUNTER — Telehealth: Payer: Self-pay | Admitting: Emergency Medicine

## 2017-10-19 NOTE — Telephone Encounter (Signed)
Patient called requesting refill of his sildenafil for ED.  He is having no problems with the medication.  Number 100 tablets were called in to the [facility] with 2 refills.

## 2017-12-22 ENCOUNTER — Encounter (HOSPITAL_COMMUNITY): Payer: Self-pay

## 2017-12-22 ENCOUNTER — Ambulatory Visit (HOSPITAL_COMMUNITY)
Admission: EM | Admit: 2017-12-22 | Discharge: 2017-12-22 | Disposition: A | Discharge status: Home / Self Care | Payer: 59 | Attending: Family Medicine | Admitting: Family Medicine

## 2017-12-22 DIAGNOSIS — Z760 Encounter for issue of repeat prescription: Secondary | ICD-10-CM

## 2017-12-22 DIAGNOSIS — F419 Anxiety disorder, unspecified: Secondary | ICD-10-CM

## 2017-12-22 MED ORDER — HYDROXYZINE HCL 25 MG PO TABS: 25.0000 mg | ORAL_TABLET | Freq: Four times a day (QID) | ORAL | 0 refills | Status: DC | PRN

## 2017-12-22 NOTE — Discharge Instructions (Signed)
Unfortunately vyvanse or valium are not medications which are managed here through urgent care.  If you are feeling uptight/anxious sensation you may try hydroxyzine as this may provide some benefit. May cause drowsiness.  Please establish with a primary care provider for management of medications and complete annual physical.

## 2017-12-22 NOTE — ED Provider Notes (Signed)
[facility]    CSN: [id number] Arrival date & time: 12/22/17  1250     History   Chief Complaint Chief Complaint  Patient presents with  . Medication Refill    HPI Martin Velazquez is a 47 y.o. male.   Martin Velazquez presents with requests for refill of vyvanse. States he has been prescribed it in the past, has been years, didn't use regularly. States he has a lot of work/project upcoming and needs help with calming and focusing. States he has also used valium for this in the past as well. His PCP retired and has not establishd with a new one, has an appointment to establish care in three days however. No shortness of breath, panic attack sensation, depression. Does not take any medications regularly. Hx of ADD.    ROS per HPI.       Past Medical History:  Diagnosis Date  . Back pain    herniated disc radiculopathy and lumbago  . Eczema    around nose occasionally  . Sleep apnea    CPAP(doesn't use);sleep study done 3+yrs ago    Patient Active Problem List   Diagnosis Date Noted  . Radiculopathy of cervical spine 08/29/2016  . Erectile dysfunction 04/15/2013  . ADD (attention deficit disorder) 04/15/2013    Past Surgical History:  Procedure Laterality Date  . APPENDECTOMY  56yrs ago  . COLONOSCOPY    . LUMBAR LAMINECTOMY/DECOMPRESSION MICRODISCECTOMY N/A 07/25/2012   Procedure: Lumbar five-sacral one diskectomy;  Surgeon: Cristi Loron, MD;  Location: [facility];  Service: Neurosurgery;  Laterality: N/A;  Lumbar five-sacral one diskectomy  . right ear surgery  at age 21       Home Medications    Prior to Admission medications   Medication Sig Start Date End Date Taking? Authorizing Provider  hydrOXYzine (ATARAX/VISTARIL) 25 MG tablet Take 1 tablet (25 mg total) by mouth every 6 (six) hours as needed for anxiety. 12/22/17   Georgetta Haber, NP    Family History Family History  Problem Relation Age of Onset  . Cancer Mother     Social  History Social History   Tobacco Use  . Smoking status: Never Smoker  . Smokeless tobacco: Never Used  Substance Use Topics  . Alcohol use: Yes    Comment: occasionally  . Drug use: No     Allergies   Patient has no known allergies.   Review of Systems Review of Systems   Physical Exam Triage Vital Signs ED Triage Vitals [12/22/17 1334]  Enc Vitals Group     BP (!) 146/67     Pulse Rate 63     Resp 20     Temp 98 F (36.7 C)     Temp Source Oral     SpO2 98 %     Weight      Height      Head Circumference      Peak Flow      Pain Score 0     Pain Loc      Pain Edu?      Excl. in GC?    No data found.  Updated Vital Signs BP (!) 146/67 (BP Location: Left Arm)   Pulse 63   Temp 98 F (36.7 C) (Oral)   Resp 20   SpO2 98%    Physical Exam  Constitutional: He is oriented to person, place, and time. He appears well-developed and well-nourished.  Cardiovascular: Normal rate and regular rhythm.  Pulmonary/Chest: Effort normal and breath sounds normal.  Neurological: He is alert and oriented to person, place, and time.  Skin: Skin is warm and dry.  Psychiatric: He has a normal mood and affect. His behavior is normal. Thought content normal.     UC Treatments / Results  Labs (all labs ordered are listed, but only abnormal results are displayed) Labs Reviewed - No data to display  EKG None  Radiology No results found.  Procedures Procedures (including critical care time)  Medications Ordered in UC Medications - No data to display  Initial Impression / Assessment and Plan / UC Course  I have reviewed the triage vital signs and the nursing notes.  Pertinent labs & imaging results that were available during my care of the patient were reviewed by me and considered in my medical decision making (see chart for details).     Discussed with patient that both vyvanse and valium are not medications which are managed here through urgent care. In no  acute distress at this time. Patient expresses that valium was helpful in calming him adequately to help him, opted to try vistaril to see if this may provide a similar response. Encouraged establish with PCP for recheck and management of medications as needed. Patient verbalized understanding and agreeable to plan.   Final Clinical Impressions(s) / UC Diagnoses   Final diagnoses:  Medication refill  Anxiety     Discharge Instructions     Unfortunately vyvanse or valium are not medications which are managed here through urgent care.  If you are feeling uptight/anxious sensation you may try hydroxyzine as this may provide some benefit. May cause drowsiness.  Please establish with a primary care provider for management of medications and complete annual physical.     ED Prescriptions    Medication Sig Dispense Auth. Provider   hydrOXYzine (ATARAX/VISTARIL) 25 MG tablet Take 1 tablet (25 mg total) by mouth every 6 (six) hours as needed for anxiety. 12 tablet Georgetta Haber, NP     Controlled Substance Prescriptions [place] Controlled Substance Registry consulted? Not Applicable   Georgetta Haber, NP 12/22/17 1358

## 2017-12-22 NOTE — ED Triage Notes (Signed)
Pt presents for medication refill.  

## 2017-12-25 ENCOUNTER — Encounter: Payer: Self-pay | Admitting: Family Medicine

## 2017-12-25 ENCOUNTER — Ambulatory Visit: Payer: 59 | Admitting: Family Medicine

## 2017-12-25 ENCOUNTER — Other Ambulatory Visit: Payer: Self-pay

## 2017-12-25 VITALS — BP 112/60 | HR 53 | Temp 98.2°F | Ht 72.0 in | Wt 209.8 lb

## 2017-12-25 DIAGNOSIS — F988 Other specified behavioral and emotional disorders with onset usually occurring in childhood and adolescence: Secondary | ICD-10-CM | POA: Diagnosis not present

## 2017-12-25 DIAGNOSIS — F418 Other specified anxiety disorders: Secondary | ICD-10-CM

## 2017-12-25 MED ORDER — LISDEXAMFETAMINE DIMESYLATE 30 MG PO CAPS: 30.0000 mg | ORAL_CAPSULE | Freq: Every day | ORAL | 0 refills | Status: DC

## 2017-12-25 MED ORDER — DIAZEPAM 5 MG PO TABS: 2.5000 mg | ORAL_TABLET | Freq: Two times a day (BID) | ORAL | 0 refills | Status: DC | PRN

## 2017-12-25 NOTE — Patient Instructions (Addendum)
   Vyvanse refilled temporarily, but as we discussed if that is needed persistently, would recommend repeat testing.  Please follow-up if refills needed.  Low-dose Valium if needed for situational/social anxiety, but please follow-up if the symptoms persist or any refills needed.  Good luck on your upcoming test.  If you have lab work done today you will be contacted with your lab results within the next 2 weeks.  If you have not heard from [place] then please contact us. The fastest way to get your results is to register for My Chart.   IF you received an x-ray today, you will receive an invoice from [facility]. Please contact [facility] at [phone] with questions or concerns regarding your invoice.   IF you received labwork today, you will receive an invoice from [place]. Please contact [facility] at [phone] with questions or concerns regarding your invoice.   Our billing staff will not be able to assist you with questions regarding bills from these companies.  You will be contacted with the lab results as soon as they are available. The fastest way to get your results is to activate your My Chart account. Instructions are located on the last page of this paperwork. If you have not heard from [place] regarding the results in 2 weeks, please contact this office.

## 2017-12-25 NOTE — Progress Notes (Signed)
Subjective:    Patient ID: Martin Velazquez, male    DOB: August 21, 1970, 47 y.o.   MRN: [id number]  HPI Martin Velazquez is a 47 y.o. male Presents today for: Chief Complaint  Patient presents with  . 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  He was evaluated by Dr. Cleta Velazquez years ago, noted episodic use of Vyvanse for attention deficit at that time.  Briefly discussed January 2015.  Occasionally took ADD medicines when he had to study a lot at that time.  Last prescription for #30 of Vyvanse on 11/12/2015.  [employer] with [employer].  Has had ADD since childhood. Had been on daily meds when younger, and had prior testing. 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  Has test coming up next Monday.  Has been studying ok, but getting distracted. Difficult to study and retaining.   Has had some difficulty with public speaking, and testing anxiety.  Has used taken valium in past - many years ago. No side effects with valium prior.  Saw [facility] urgent [facility] recently and was prescribed hydroxyzine - tried half of hydroxyzine 2 days ago but was not anxious at that time. Has managed anxiety with exercise/fitness. Does not feel anxious usually.  Depression screen [facility] 2/9 12/25/2017 10/23/2016 08/29/2016 07/26/2015 05/24/2015  Decreased Interest 0 - 0 0 0  Down, Depressed, Hopeless 0 0 0 0 0  PHQ - 2 Score 0 0 0 0 0   Review of Systems  Psychiatric/Behavioral: Positive for decreased concentration. Negative for agitation, self-injury, sleep disturbance and suicidal ideas. The patient is nervous/anxious (social at times only. ). The patient is not hyperactive.        Objective:   Physical Exam  Constitutional: He is oriented to person, place, and time. He appears well-developed and well-nourished.  HENT:  Head: Normocephalic and atraumatic.  Eyes: Pupils are equal, round, and reactive to light. EOM are  normal.  Cardiovascular: Normal rate, regular rhythm, normal heart sounds and intact distal pulses.  No extrasystoles are present.  No murmur heard. Pulmonary/Chest: Effort normal and breath sounds normal.  Neurological: He is alert and oriented to person, place, and time.  Skin: Skin is warm and dry.  Psychiatric: He has a normal mood and affect. His behavior is normal. Judgment and thought content normal.       Assessment & Plan:   Martin Velazquez is a 47 y.o. male Attention deficit disorder, unspecified hyperactivity presence - Plan: lisdexamfetamine (VYVANSE) 30 MG capsule  -Chart reviewed for previous notes from prior primary care provider.  -History of ADD since childhood, has been fairly well managed off meds recently.  However has required some increased focus less attention for studying for upcoming exam.  -Restart Vyvanse low-dose, then if persistent refills needed, would be helpful to have updated formal ADD testing.  Situational anxiety - Plan: diazepam (VALIUM) 5 MG tablet  -Testing anxiety, social anxiety component likely with speaking.  Value provided for short-term use if needed, follow-up if persistent need or refills.   Meds ordered this encounter  Medications  . lisdexamfetamine (VYVANSE) 30 MG capsule    Sig: Take 1 capsule (30 mg total) by mouth daily.    Dispense:  30 capsule    Refill:  0  . diazepam (VALIUM) 5 MG tablet    Sig: Take 0.5-1 tablets (2.5-5 mg total)  by mouth every 12 (twelve) hours as needed for anxiety.    Dispense:  10 tablet    Refill:  0   Patient Instructions     Vyvanse refilled temporarily, but as we discussed if that is needed persistently, would recommend repeat testing.  Please follow-up if refills needed.  Low-dose Valium if needed for situational/social anxiety, but please follow-up if the symptoms persist or any refills needed.  Good luck on your upcoming test.  If you have lab work done today you will be contacted with your  lab results within the next 2 weeks.  If you have not heard from [place] then please contact us. The fastest way to get your results is to register for My Chart.   IF you received an x-ray today, you will receive an invoice from [facility]. Please contact [facility] at [phone] with questions or concerns regarding your invoice.   IF you received labwork today, you will receive an invoice from [place]. Please contact [facility] at [phone] with questions or concerns regarding your invoice.   Our billing staff will not be able to assist you with questions regarding bills from these companies.  You will be contacted with the lab results as soon as they are available. The fastest way to get your results is to activate your My Chart account. Instructions are located on the last page of this paperwork. If you have not heard from [place] regarding the results in 2 weeks, please contact this office.      Signed,   Martin Staggers, MD [facility].  12/25/17 8:41 AM

## 2018-08-06 ENCOUNTER — Other Ambulatory Visit: Payer: Self-pay

## 2018-08-06 ENCOUNTER — Other Ambulatory Visit: Payer: Self-pay | Admitting: Nurse Practitioner

## 2018-08-06 ENCOUNTER — Ambulatory Visit
Admission: RE | Admit: 2018-08-06 | Discharge: 2018-08-06 | Disposition: A | Discharge status: Home / Self Care | Payer: No Typology Code available for payment source | Source: Ambulatory Visit | Attending: Nurse Practitioner | Admitting: Nurse Practitioner

## 2018-08-06 DIAGNOSIS — R52 Pain, unspecified: Secondary | ICD-10-CM

## 2019-05-09 ENCOUNTER — Ambulatory Visit: Payer: Self-pay | Attending: Internal Medicine

## 2019-05-09 DIAGNOSIS — Z23 Encounter for immunization: Secondary | ICD-10-CM | POA: Insufficient documentation

## 2019-05-09 NOTE — Progress Notes (Signed)
   Covid-19 Vaccination Clinic  Name:  BURLE KWAN    MRN: [id number] DOB: 21-Jan-1971  05/09/2019  Mr. Bloodworth was observed post Covid-19 immunization for 15 minutes without incidence. He was provided with Vaccine Information Sheet and instruction to access the V-Safe system.   Mr. Diveley was instructed to call 911 with any severe reactions post vaccine: Marland Kitchen Difficulty breathing  . Swelling of your face and throat  . A fast heartbeat  . A bad rash all over your body  . Dizziness and weakness    Immunizations Administered    Name Date Dose VIS Date Route   Pfizer COVID-19 Vaccine 05/09/2019  1:11 PM 0.3 mL 02/28/2019 Intramuscular   Manufacturer: [employer], [employer]   Lot: [id number]   NDC: [address]-9

## 2019-06-03 ENCOUNTER — Ambulatory Visit: Payer: Self-pay | Attending: Internal Medicine

## 2019-06-03 DIAGNOSIS — Z23 Encounter for immunization: Secondary | ICD-10-CM

## 2019-12-19 ENCOUNTER — Ambulatory Visit
Admission: RE | Admit: 2019-12-19 | Discharge: 2019-12-19 | Disposition: A | Discharge status: Home / Self Care | Payer: No Typology Code available for payment source | Source: Ambulatory Visit | Attending: Nurse Practitioner | Admitting: Nurse Practitioner

## 2019-12-19 ENCOUNTER — Other Ambulatory Visit: Payer: Self-pay | Admitting: Nurse Practitioner

## 2019-12-19 ENCOUNTER — Other Ambulatory Visit: Payer: Self-pay

## 2019-12-19 DIAGNOSIS — R52 Pain, unspecified: Secondary | ICD-10-CM

## 2020-04-01 ENCOUNTER — Ambulatory Visit: Payer: Self-pay | Attending: Internal Medicine

## 2020-04-01 DIAGNOSIS — Z23 Encounter for immunization: Secondary | ICD-10-CM

## 2020-04-01 NOTE — Progress Notes (Signed)
   Covid-19 Vaccination Clinic  Name:  KALEP FULL    MRN: [id number] DOB: 07-17-1970  04/01/2020  Mr. Dulworth was observed post Covid-19 immunization for 15 minutes without incident. He was provided with Vaccine Information Sheet and instruction to access the V-Safe system.   Mr. Lagrange was instructed to call 911 with any severe reactions post vaccine: Marland Kitchen Difficulty breathing  . Swelling of face and throat  . A fast heartbeat  . A bad rash all over body  . Dizziness and weakness   Immunizations Administered    Name Date Dose VIS Date Route   Pfizer COVID-19 Vaccine 04/01/2020  3:51 PM 0.3 mL 01/07/2020 Intramuscular   Manufacturer: [employer], [employer]   Lot: [id number]   NDC: [address]-3

## 2021-05-28 IMAGING — CR DG LUMBAR SPINE COMPLETE 4+V
5 series · 5 of 5 positions shown · non-contrast
Comparison: July 25, 2012.

CLINICAL DATA: Low back pain after heavy lifting.

EXAM:
LUMBAR SPINE - COMPLETE 4+ VIEW

[t lumbar spine ap]
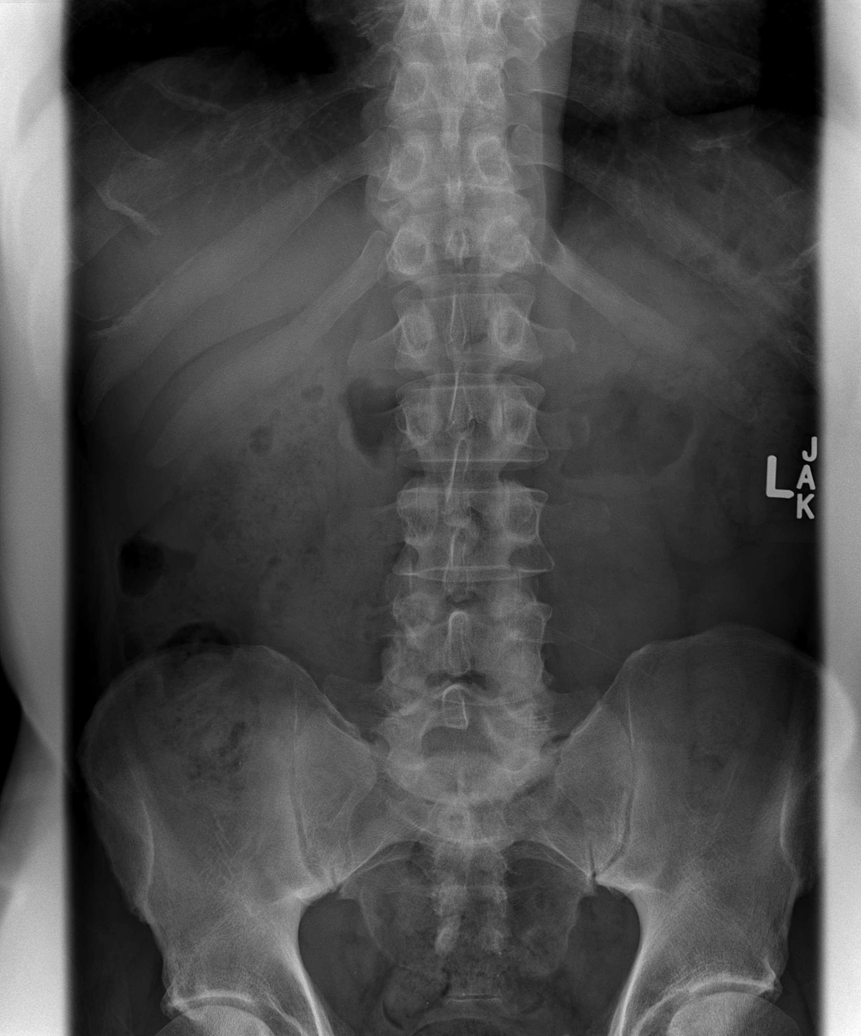

[t lumbar spine obl (1 of 2)]
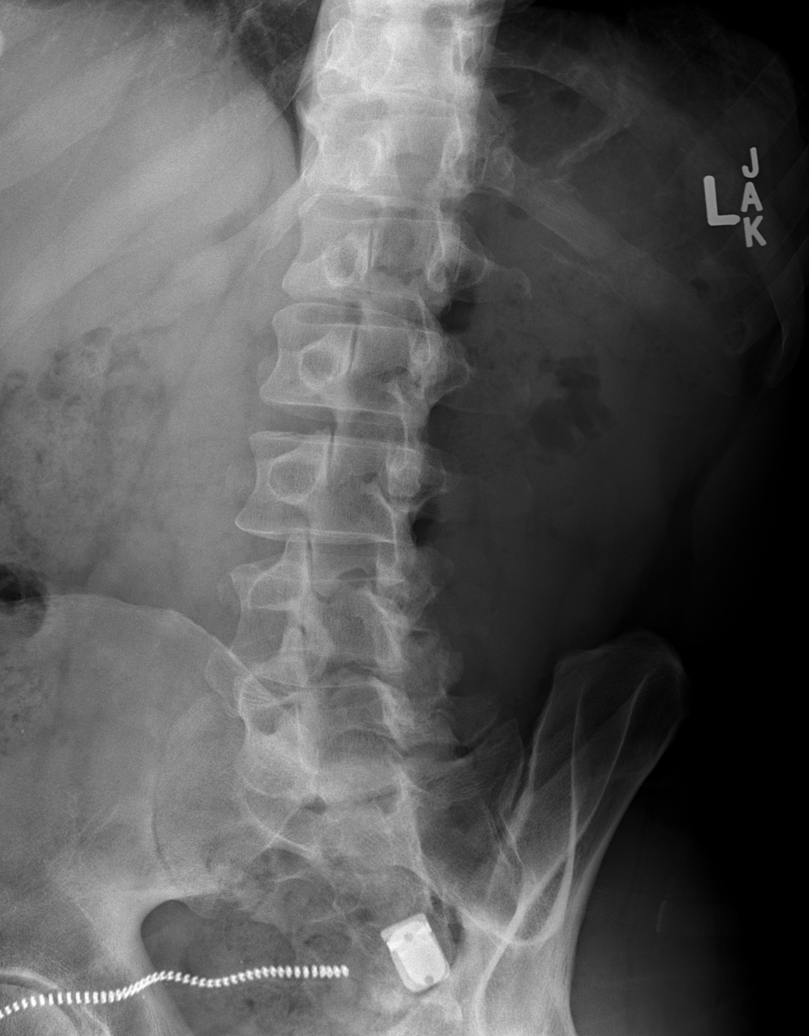

[t lumbar spine obl (2 of 2)]
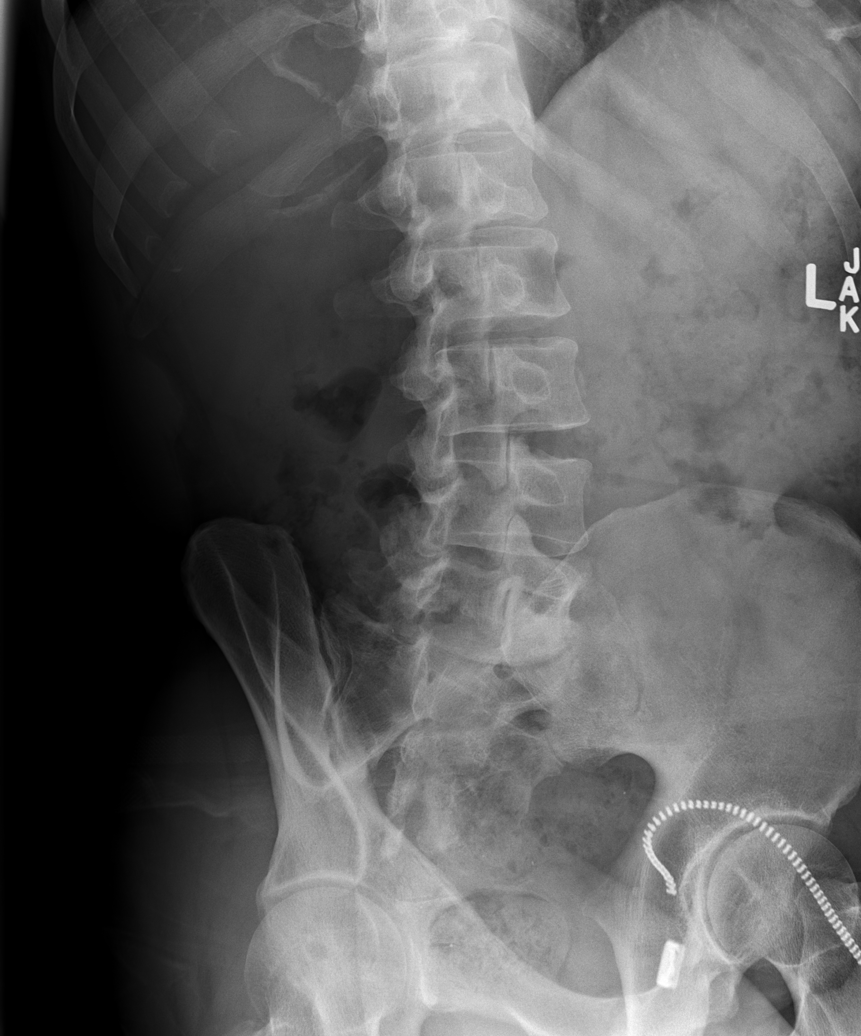

[t lumbar spine lat]
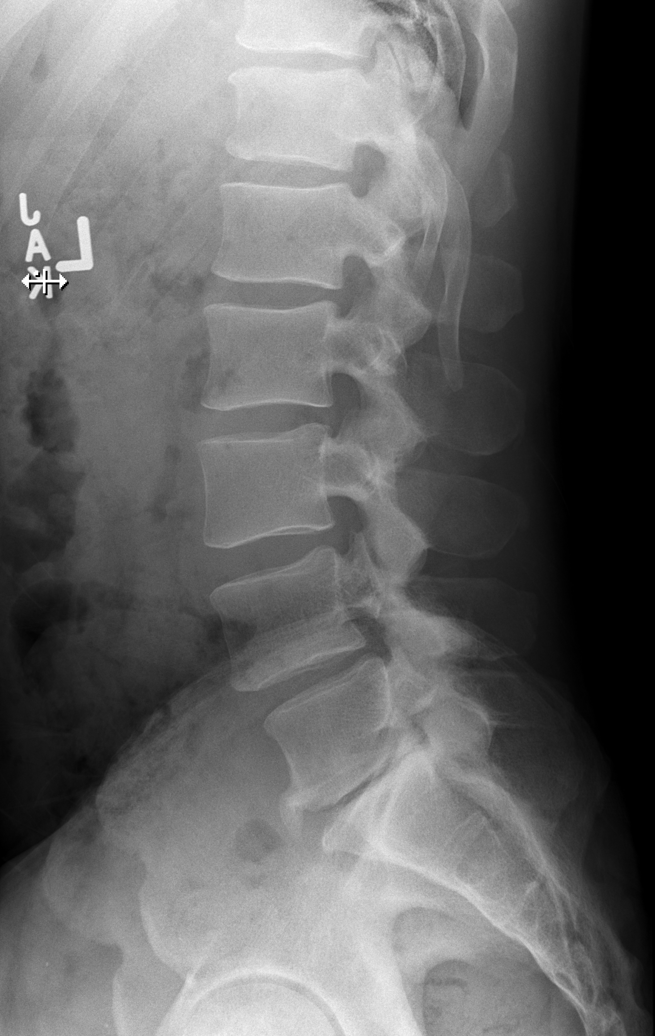

[t lumbar l-5 s-1 spot]
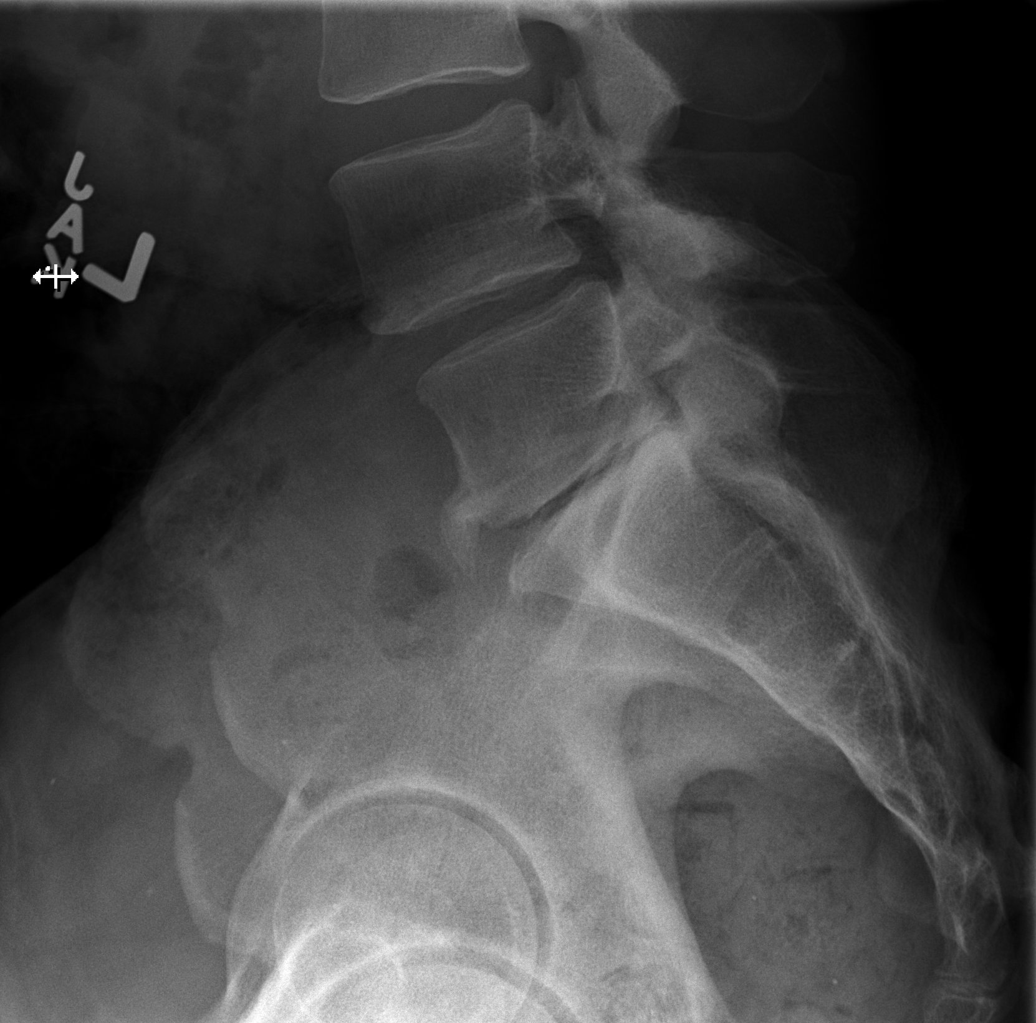

[5 of 5 positions shown; findings below may reference images not displayed]

FINDINGS: Minimal grade 1 anterolisthesis of L4-5 is noted secondary to
posterior facet joint hypertrophy. Mild degenerative disc disease is
noted at L4-5. Moderate degenerative disc disease is noted at L5-S1.
No fracture is noted.
IMPRESSION: Moderate degenerative disc disease is noted at L5-S1. No acute
abnormality seen in the lumbar spine.

## 2021-12-26 ENCOUNTER — Other Ambulatory Visit: Payer: Self-pay | Admitting: Neurosurgery

## 2022-01-18 ENCOUNTER — Other Ambulatory Visit: Payer: Self-pay | Admitting: Neurosurgery

## 2022-03-14 ENCOUNTER — Other Ambulatory Visit (HOSPITAL_COMMUNITY): Payer: Self-pay

## 2022-04-17 NOTE — Progress Notes (Signed)
Surgical Instructions    Your procedure is scheduled on Monday, April 24, 2022.  Report to [facility] Main Entrance "A" at 5:30 A.M., then check in with the Admitting office.  Call this number if you have problems the morning of surgery:  [phone]   If you have any questions prior to your surgery date call [phone]: Open Monday-Friday 8am-4pm If you experience any cold or flu symptoms such as cough, fever, chills, shortness of breath, etc. between now and your scheduled surgery, please notify us at the above number     Remember:  Do not eat or drink after midnight the night before your surgery    Take these medicines the morning of surgery with A SIP OF WATER: None   As of today, STOP taking any Aspirin (unless otherwise instructed by your surgeon) Aleve, Naproxen, Ibuprofen, Motrin, Advil, Goody's, BC's, all herbal medications, fish oil, and all vitamins.           Do not wear jewelry or makeup. Do not wear lotions, powders, cologne or deodorant. Do not shave 48 hours prior to surgery.  Men may shave face and neck. Do not bring valuables to the hospital.  [facility] is not responsible for any belongings or valuables.    Do NOT Smoke (Tobacco/Vaping)  24 hours prior to your procedure  If you use a CPAP at night, you may bring your mask for your overnight stay.   Contacts, glasses, hearing aids, dentures or partials may not be worn into surgery, please bring cases for these belongings   For patients admitted to the hospital, discharge time will be determined by your treatment team.   Patients discharged the day of surgery will not be allowed to drive home, and someone needs to stay with them for 24 hours.   SURGICAL WAITING ROOM VISITATION Patients having surgery or a procedure may have no more than 2 support people in the waiting area - these visitors may rotate.   Children under the age of 26 must have an adult with them who is not the patient. If the patient  needs to stay at the hospital during part of their recovery, the visitor guidelines for inpatient rooms apply. Pre-op nurse will coordinate an appropriate time for 1 support person to accompany patient in pre-op.  This support person may not rotate.   Please refer to [email or web address] for the visitor guidelines for Inpatients (after your surgery is over and you are in a regular room).    Special instructions:    Oral Hygiene is also important to reduce your risk of infection.  Remember - BRUSH YOUR TEETH THE MORNING OF SURGERY WITH YOUR REGULAR TOOTHPASTE   [place]- Preparing For Surgery  Before surgery, you can play an important role. Because skin is not sterile, your skin needs to be as free of germs as possible. You can reduce the number of germs on your skin by washing with CHG (chlorahexidine gluconate) Soap before surgery.  CHG is an antiseptic cleaner which kills germs and bonds with the skin to continue killing germs even after washing.     Please do not use if you have an allergy to CHG or antibacterial soaps. If your skin becomes reddened/irritated stop using the CHG.  Do not shave (including legs and underarms) for at least 48 hours prior to first CHG shower. It is OK to shave your face.  Please follow these instructions carefully.     Shower the NIGHT BEFORE SURGERY and the [facility]  OF SURGERY with CHG Soap.   If you chose to wash your hair, wash your hair first as usual with your normal shampoo. After you shampoo, rinse your hair and body thoroughly to remove the shampoo.  Then [employer] and genitals (private parts) with your normal soap and rinse thoroughly to remove soap.  After that Use CHG Soap as you would any other liquid soap. You can apply CHG directly to the skin and wash gently with a scrungie or a clean washcloth.   Apply the CHG Soap to your body ONLY FROM THE NECK DOWN.  Do not use on open wounds or open  sores. Avoid contact with your eyes, ears, mouth and genitals (private parts). Wash Face and genitals (private parts)  with your normal soap.   Wash thoroughly, paying special attention to the area where your surgery will be performed.  Thoroughly rinse your body with warm water from the neck down.  DO NOT shower/wash with your normal soap after using and rinsing off the CHG Soap.  Pat yourself dry with a CLEAN TOWEL.  Wear CLEAN PAJAMAS to bed the night before surgery  Place CLEAN SHEETS on your bed the night before your surgery  DO NOT SLEEP WITH PETS.   Day of Surgery:  Take a shower with CHG soap. Wear Clean/Comfortable clothing the morning of surgery Do not apply any deodorants/lotions.   Remember to brush your teeth WITH YOUR REGULAR TOOTHPASTE.    If you received a COVID test during your pre-op visit, it is requested that you wear a mask when out in public, stay away from anyone that may not be feeling well, and notify your surgeon if you develop symptoms. If you have been in contact with anyone that has tested positive in the last 10 days, please notify your surgeon.    Please read over the following fact sheets that you were given.

## 2022-04-18 ENCOUNTER — Encounter (HOSPITAL_COMMUNITY): Payer: Self-pay

## 2022-04-18 ENCOUNTER — Encounter (HOSPITAL_COMMUNITY)
Admission: RE | Admit: 2022-04-18 | Discharge: 2022-04-18 | Disposition: A | Discharge status: Home / Self Care | Payer: No Typology Code available for payment source | Source: Ambulatory Visit | Attending: Neurosurgery | Admitting: Neurosurgery

## 2022-04-18 ENCOUNTER — Other Ambulatory Visit: Payer: Self-pay

## 2022-04-18 VITALS — BP 141/66 | HR 52 | Temp 98.3°F | Resp 18 | Ht 72.0 in | Wt 220.0 lb

## 2022-04-18 DIAGNOSIS — Z01818 Encounter for other preprocedural examination: Secondary | ICD-10-CM

## 2022-04-18 DIAGNOSIS — Z01812 Encounter for preprocedural laboratory examination: Secondary | ICD-10-CM | POA: Diagnosis present

## 2022-04-18 LAB — CBC
HCT: 41.8 % (ref 39.0–52.0)
Hemoglobin: 14 g/dL (ref 13.0–17.0)
MCH: 30.7 pg (ref 26.0–34.0)
MCHC: 33.5 g/dL (ref 30.0–36.0)
MCV: 91.7 fL (ref 80.0–100.0)
Platelets: 296 10*3/uL (ref 150–400)
RBC: 4.56 MIL/uL (ref 4.22–5.81)
RDW: 11.7 % (ref 11.5–15.5)
WBC: 7.4 10*3/uL (ref 4.0–10.5)
nRBC: 0 % (ref 0.0–0.2)

## 2022-04-18 LAB — SURGICAL PCR SCREEN

## 2022-04-18 LAB — TYPE AND SCREEN
ABO/RH(D): B POS
Antibody Screen: NEGATIVE

## 2022-04-18 NOTE — Progress Notes (Signed)
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 Patient agrees to go over the instructions while at home for a better understanding.  The opportunity to ask questions was provided.

## 2022-04-19 ENCOUNTER — Other Ambulatory Visit (HOSPITAL_COMMUNITY): Payer: Self-pay

## 2022-04-19 NOTE — Progress Notes (Signed)
Re-collect PCR day of surgery. Lab reported "invalid results."

## 2022-04-23 NOTE — Anesthesia Preprocedure Evaluation (Signed)
Anesthesia Evaluation  Patient identified by MRN, date of birth, ID band Patient awake    Reviewed: Allergy & Precautions, NPO status , Patient's Chart, lab work & pertinent test results  Airway Mallampati: III  TM Distance: >3 FB Neck ROM: Full    Dental  (+) Teeth Intact, Dental Advisory Given   Pulmonary sleep apnea    Pulmonary exam normal breath sounds clear to auscultation       Cardiovascular negative cardio ROS Normal cardiovascular exam Rhythm:Regular Rate:Normal     Neuro/Psych SPONDYLOLISTHESIS, LUMBAR REGION  S/p CERVICAL DISC ARTHROPLASTY  Neuromuscular disease    GI/Hepatic negative GI ROS, Neg liver ROS,,,  Endo/Other  negative endocrine ROS    Renal/GU negative Renal ROS     Musculoskeletal negative musculoskeletal ROS (+)    Abdominal   Peds  (+) ATTENTION DEFICIT DISORDER WITHOUT HYPERACTIVITY Hematology negative hematology ROS (+)   Anesthesia Other Findings   Reproductive/Obstetrics                             Anesthesia Physical Anesthesia Plan  ASA: 2  Anesthesia Plan: General   Post-op Pain Management: Tylenol PO (pre-op)* and Ketamine IV*   Induction: Intravenous  PONV Risk Score and Plan: 3 and Midazolam, Dexamethasone and Ondansetron  Airway Management Planned: Oral ETT and Video Laryngoscope Planned  Additional Equipment:   Intra-op Plan:   Post-operative Plan: Extubation in OR  Informed Consent: I have reviewed the patients History and Physical, chart, labs and discussed the procedure including the risks, benefits and alternatives for the proposed anesthesia with the patient or authorized representative who has indicated his/her understanding and acceptance.     Dental advisory given  Plan Discussed with: CRNA  Anesthesia Plan Comments: (2nd PIV after induction )       Anesthesia Quick Evaluation

## 2022-04-24 ENCOUNTER — Ambulatory Visit (HOSPITAL_COMMUNITY): Payer: Self-pay

## 2022-04-24 ENCOUNTER — Other Ambulatory Visit: Payer: Self-pay

## 2022-04-24 ENCOUNTER — Ambulatory Visit (HOSPITAL_COMMUNITY)
Admission: RE | Admit: 2022-04-24 | Discharge: 2022-04-24 | Disposition: A | Discharge status: Home / Self Care | Payer: No Typology Code available for payment source | Attending: Neurosurgery | Admitting: Neurosurgery

## 2022-04-24 ENCOUNTER — Encounter (HOSPITAL_COMMUNITY)
Admission: RE | Disposition: A | Discharge status: Home / Self Care | Payer: Self-pay | Source: Home / Self Care | Attending: Neurosurgery

## 2022-04-24 ENCOUNTER — Ambulatory Visit (HOSPITAL_BASED_OUTPATIENT_CLINIC_OR_DEPARTMENT_OTHER): Payer: No Typology Code available for payment source | Admitting: Anesthesiology

## 2022-04-24 ENCOUNTER — Ambulatory Visit (HOSPITAL_COMMUNITY): Payer: No Typology Code available for payment source | Admitting: Anesthesiology

## 2022-04-24 ENCOUNTER — Encounter (HOSPITAL_COMMUNITY): Payer: Self-pay | Admitting: Neurosurgery

## 2022-04-24 DIAGNOSIS — Z01818 Encounter for other preprocedural examination: Secondary | ICD-10-CM

## 2022-04-24 DIAGNOSIS — M5116 Intervertebral disc disorders with radiculopathy, lumbar region: Secondary | ICD-10-CM | POA: Insufficient documentation

## 2022-04-24 DIAGNOSIS — M4316 Spondylolisthesis, lumbar region: Secondary | ICD-10-CM | POA: Diagnosis present

## 2022-04-24 DIAGNOSIS — M4726 Other spondylosis with radiculopathy, lumbar region: Secondary | ICD-10-CM | POA: Insufficient documentation

## 2022-04-24 DIAGNOSIS — M48062 Spinal stenosis, lumbar region with neurogenic claudication: Secondary | ICD-10-CM | POA: Insufficient documentation

## 2022-04-24 DIAGNOSIS — G4730 Sleep apnea, unspecified: Secondary | ICD-10-CM | POA: Diagnosis not present

## 2022-04-24 LAB — SURGICAL PCR SCREEN
MRSA, PCR: NEGATIVE
Staphylococcus aureus: NEGATIVE

## 2022-04-24 LAB — ABO/RH: ABO/RH(D): B POS

## 2022-04-24 SURGERY — POSTERIOR LUMBAR FUSION 1 LEVEL
Anesthesia: General

## 2022-04-24 MED ORDER — BUPIVACAINE LIPOSOME 1.3 % IJ SUSP
INTRAMUSCULAR | Status: AC
  Filled 2022-04-24: qty 20

## 2022-04-24 MED ORDER — LACTATED RINGERS IV SOLN: INTRAVENOUS | Status: DC

## 2022-04-24 MED ORDER — SODIUM CHLORIDE 0.9 % IV SOLN
250.0000 mL | INTRAVENOUS | Status: DC
  Administered 2022-04-24: 250 mL via INTRAVENOUS

## 2022-04-24 MED ORDER — ONDANSETRON HCL 4 MG/2ML IJ SOLN: 4.0000 mg | Freq: Four times a day (QID) | INTRAMUSCULAR | Status: DC | PRN

## 2022-04-24 MED ORDER — LIDOCAINE 2% (20 MG/ML) 5 ML SYRINGE
INTRAMUSCULAR | Status: DC | PRN
  Administered 2022-04-24: 100 mg via INTRAVENOUS

## 2022-04-24 MED ORDER — ONDANSETRON HCL 4 MG PO TABS: 4.0000 mg | ORAL_TABLET | Freq: Four times a day (QID) | ORAL | Status: DC | PRN

## 2022-04-24 MED ORDER — CEFAZOLIN SODIUM-DEXTROSE 2-4 GM/100ML-% IV SOLN: 2.0000 g | INTRAVENOUS | Status: DC

## 2022-04-24 MED ORDER — PHENYLEPHRINE HCL-NACL 20-0.9 MG/250ML-% IV SOLN
INTRAVENOUS | Status: DC | PRN
  Administered 2022-04-24: 20 ug/min via INTRAVENOUS

## 2022-04-24 MED ORDER — PROPOFOL 10 MG/ML IV BOLUS
INTRAVENOUS | Status: DC | PRN
  Administered 2022-04-24: 200 mg via INTRAVENOUS

## 2022-04-24 MED ORDER — DEXAMETHASONE SODIUM PHOSPHATE 4 MG/ML IJ SOLN
INTRAMUSCULAR | Status: DC | PRN
  Administered 2022-04-24: 10 mg via INTRAVENOUS

## 2022-04-24 MED ORDER — CHLORHEXIDINE GLUCONATE 0.12 % MT SOLN
15.0000 mL | Freq: Once | OROMUCOSAL | Status: AC
  Administered 2022-04-24: 15 mL via OROMUCOSAL
  Filled 2022-04-24: qty 15

## 2022-04-24 MED ORDER — THROMBIN 5000 UNITS EX SOLR: OROMUCOSAL | Status: DC | PRN

## 2022-04-24 MED ORDER — ONDANSETRON HCL 4 MG/2ML IJ SOLN
INTRAMUSCULAR | Status: DC | PRN
  Administered 2022-04-24: 4 mg via INTRAVENOUS

## 2022-04-24 MED ORDER — ROCURONIUM BROMIDE 10 MG/ML (PF) SYRINGE
PREFILLED_SYRINGE | INTRAVENOUS | Status: DC | PRN
  Administered 2022-04-24: 30 mg via INTRAVENOUS
  Administered 2022-04-24: 70 mg via INTRAVENOUS

## 2022-04-24 MED ORDER — MIDAZOLAM HCL 5 MG/5ML IJ SOLN
INTRAMUSCULAR | Status: DC | PRN
  Administered 2022-04-24: 2 mg via INTRAVENOUS

## 2022-04-24 MED ORDER — PROPOFOL 10 MG/ML IV BOLUS
INTRAVENOUS | Status: AC
  Filled 2022-04-24: qty 20

## 2022-04-24 MED ORDER — CHLORHEXIDINE GLUCONATE CLOTH 2 % EX PADS: 6.0000 | MEDICATED_PAD | Freq: Once | CUTANEOUS | Status: DC

## 2022-04-24 MED ORDER — CYCLOBENZAPRINE HCL 10 MG PO TABS: 10.0000 mg | ORAL_TABLET | Freq: Three times a day (TID) | ORAL | 0 refills | Status: DC | PRN

## 2022-04-24 MED ORDER — ACETAMINOPHEN 500 MG PO TABS
1000.0000 mg | ORAL_TABLET | Freq: Four times a day (QID) | ORAL | Status: DC
  Administered 2022-04-24 (×2): 1000 mg via ORAL
  Filled 2022-04-24 (×2): qty 2

## 2022-04-24 MED ORDER — KETAMINE HCL 10 MG/ML IJ SOLN
INTRAMUSCULAR | Status: DC | PRN
  Administered 2022-04-24: 50 mg via INTRAVENOUS

## 2022-04-24 MED ORDER — CYCLOBENZAPRINE HCL 10 MG PO TABS: 10.0000 mg | ORAL_TABLET | Freq: Three times a day (TID) | ORAL | Status: DC | PRN

## 2022-04-24 MED ORDER — EPHEDRINE 5 MG/ML INJ
INTRAVENOUS | Status: AC
  Filled 2022-04-24: qty 5

## 2022-04-24 MED ORDER — ACETAMINOPHEN 650 MG RE SUPP: 650.0000 mg | RECTAL | Status: DC | PRN

## 2022-04-24 MED ORDER — KETAMINE HCL 50 MG/5ML IJ SOSY
PREFILLED_SYRINGE | INTRAMUSCULAR | Status: AC
  Filled 2022-04-24: qty 5

## 2022-04-24 MED ORDER — SODIUM CHLORIDE 0.9% FLUSH
3.0000 mL | Freq: Two times a day (BID) | INTRAVENOUS | Status: DC
  Administered 2022-04-24: 3 mL via INTRAVENOUS

## 2022-04-24 MED ORDER — SODIUM CHLORIDE 0.9% FLUSH: 3.0000 mL | INTRAVENOUS | Status: DC | PRN

## 2022-04-24 MED ORDER — PROMETHAZINE HCL 25 MG/ML IJ SOLN: 6.2500 mg | INTRAMUSCULAR | Status: DC | PRN

## 2022-04-24 MED ORDER — BUPIVACAINE-EPINEPHRINE (PF) 0.5% -1:200000 IJ SOLN
INTRAMUSCULAR | Status: AC
  Filled 2022-04-24: qty 30

## 2022-04-24 MED ORDER — OXYCODONE HCL 5 MG PO TABS: 5.0000 mg | ORAL_TABLET | ORAL | Status: DC | PRN

## 2022-04-24 MED ORDER — ROCURONIUM BROMIDE 10 MG/ML (PF) SYRINGE
PREFILLED_SYRINGE | INTRAVENOUS | Status: AC
  Filled 2022-04-24: qty 10

## 2022-04-24 MED ORDER — PHENYLEPHRINE HCL (PRESSORS) 10 MG/ML IV SOLN
INTRAVENOUS | Status: AC
  Filled 2022-04-24: qty 1

## 2022-04-24 MED ORDER — ONDANSETRON HCL 4 MG/2ML IJ SOLN
INTRAMUSCULAR | Status: AC
  Filled 2022-04-24: qty 2

## 2022-04-24 MED ORDER — THROMBIN 5000 UNITS EX SOLR
CUTANEOUS | Status: AC
  Filled 2022-04-24: qty 5000

## 2022-04-24 MED ORDER — 0.9 % SODIUM CHLORIDE (POUR BTL) OPTIME
TOPICAL | Status: DC | PRN
  Administered 2022-04-24: 1000 mL

## 2022-04-24 MED ORDER — ACETAMINOPHEN 500 MG PO TABS
1000.0000 mg | ORAL_TABLET | Freq: Once | ORAL | Status: AC
  Administered 2022-04-24: 1000 mg via ORAL
  Filled 2022-04-24: qty 2

## 2022-04-24 MED ORDER — PHENYLEPHRINE 80 MCG/ML (10ML) SYRINGE FOR IV PUSH (FOR BLOOD PRESSURE SUPPORT)
PREFILLED_SYRINGE | INTRAVENOUS | Status: DC | PRN
  Administered 2022-04-24: 80 ug via INTRAVENOUS

## 2022-04-24 MED ORDER — CEFAZOLIN IN SODIUM CHLORIDE 3-0.9 GM/100ML-% IV SOLN
3.0000 g | INTRAVENOUS | Status: AC
  Administered 2022-04-24 (×2): 2 g via INTRAVENOUS
  Filled 2022-04-24: qty 100

## 2022-04-24 MED ORDER — PHENOL 1.4 % MT LIQD: 1.0000 | OROMUCOSAL | Status: DC | PRN

## 2022-04-24 MED ORDER — BACITRACIN ZINC 500 UNIT/GM EX OINT
TOPICAL_OINTMENT | CUTANEOUS | Status: DC | PRN
  Administered 2022-04-24: 1 via TOPICAL

## 2022-04-24 MED ORDER — OXYCODONE-ACETAMINOPHEN 5-325 MG PO TABS: 1.0000 | ORAL_TABLET | ORAL | 0 refills | Status: DC | PRN

## 2022-04-24 MED ORDER — ACETAMINOPHEN 325 MG PO TABS: 650.0000 mg | ORAL_TABLET | ORAL | Status: DC | PRN

## 2022-04-24 MED ORDER — MORPHINE SULFATE (PF) 4 MG/ML IV SOLN: 4.0000 mg | INTRAVENOUS | Status: DC | PRN

## 2022-04-24 MED ORDER — MENTHOL 3 MG MT LOZG: 1.0000 | LOZENGE | OROMUCOSAL | Status: DC | PRN

## 2022-04-24 MED ORDER — FENTANYL CITRATE (PF) 250 MCG/5ML IJ SOLN
INTRAMUSCULAR | Status: AC
  Filled 2022-04-24: qty 5

## 2022-04-24 MED ORDER — MIDAZOLAM HCL 2 MG/2ML IJ SOLN
INTRAMUSCULAR | Status: AC
  Filled 2022-04-24: qty 2

## 2022-04-24 MED ORDER — CEFAZOLIN SODIUM-DEXTROSE 2-4 GM/100ML-% IV SOLN
INTRAVENOUS | Status: AC
  Filled 2022-04-24: qty 100

## 2022-04-24 MED ORDER — DOCUSATE SODIUM 100 MG PO CAPS: 100.0000 mg | ORAL_CAPSULE | Freq: Two times a day (BID) | ORAL | 0 refills | Status: DC

## 2022-04-24 MED ORDER — BISACODYL 10 MG RE SUPP: 10.0000 mg | Freq: Every day | RECTAL | Status: DC | PRN

## 2022-04-24 MED ORDER — EPHEDRINE SULFATE-NACL 50-0.9 MG/10ML-% IV SOSY
PREFILLED_SYRINGE | INTRAVENOUS | Status: DC | PRN
  Administered 2022-04-24: 5 mg via INTRAVENOUS
  Administered 2022-04-24: 10 mg via INTRAVENOUS
  Administered 2022-04-24 (×2): 5 mg via INTRAVENOUS

## 2022-04-24 MED ORDER — BUPIVACAINE-EPINEPHRINE (PF) 0.5% -1:200000 IJ SOLN
INTRAMUSCULAR | Status: DC | PRN
  Administered 2022-04-24: 10 mL via PERINEURAL

## 2022-04-24 MED ORDER — FENTANYL CITRATE (PF) 100 MCG/2ML IJ SOLN
INTRAMUSCULAR | Status: DC | PRN
  Administered 2022-04-24: 100 ug via INTRAVENOUS
  Administered 2022-04-24: 25 ug via INTRAVENOUS

## 2022-04-24 MED ORDER — FENTANYL CITRATE (PF) 100 MCG/2ML IJ SOLN: 25.0000 ug | INTRAMUSCULAR | Status: DC | PRN

## 2022-04-24 MED ORDER — LIDOCAINE 2% (20 MG/ML) 5 ML SYRINGE
INTRAMUSCULAR | Status: AC
  Filled 2022-04-24: qty 5

## 2022-04-24 MED ORDER — OXYCODONE HCL 5 MG PO TABS
10.0000 mg | ORAL_TABLET | ORAL | Status: DC | PRN
  Administered 2022-04-24: 10 mg via ORAL
  Filled 2022-04-24: qty 2

## 2022-04-24 MED ORDER — BACITRACIN ZINC 500 UNIT/GM EX OINT
TOPICAL_OINTMENT | CUTANEOUS | Status: AC
  Filled 2022-04-24: qty 28.35

## 2022-04-24 MED ORDER — ORAL CARE MOUTH RINSE: 15.0000 mL | Freq: Once | OROMUCOSAL | Status: AC

## 2022-04-24 MED ORDER — SUGAMMADEX SODIUM 200 MG/2ML IV SOLN
INTRAVENOUS | Status: DC | PRN
  Administered 2022-04-24: 200 mg via INTRAVENOUS

## 2022-04-24 MED ORDER — PHENYLEPHRINE 80 MCG/ML (10ML) SYRINGE FOR IV PUSH (FOR BLOOD PRESSURE SUPPORT)
PREFILLED_SYRINGE | INTRAVENOUS | Status: AC
  Filled 2022-04-24: qty 10

## 2022-04-24 MED ORDER — DOCUSATE SODIUM 100 MG PO CAPS
100.0000 mg | ORAL_CAPSULE | Freq: Two times a day (BID) | ORAL | Status: DC
  Administered 2022-04-24: 100 mg via ORAL
  Filled 2022-04-24: qty 1

## 2022-04-24 MED ORDER — DEXAMETHASONE SODIUM PHOSPHATE 10 MG/ML IJ SOLN
INTRAMUSCULAR | Status: AC
  Filled 2022-04-24: qty 1

## 2022-04-24 MED ORDER — GLYCOPYRROLATE 0.2 MG/ML IJ SOLN
INTRAMUSCULAR | Status: DC | PRN
  Administered 2022-04-24: .2 mg via INTRAVENOUS

## 2022-04-24 MED ORDER — CEFAZOLIN SODIUM-DEXTROSE 2-4 GM/100ML-% IV SOLN
2.0000 g | Freq: Three times a day (TID) | INTRAVENOUS | Status: DC
  Administered 2022-04-24: 2 g via INTRAVENOUS
  Filled 2022-04-24: qty 100

## 2022-04-24 SURGICAL SUPPLY — 66 items
ADH SKN CLS APL DERMABOND .7 (GAUZE/BANDAGES/DRESSINGS) ×1
APL SKNCLS STERI-STRIP NONHPOA (GAUZE/BANDAGES/DRESSINGS) ×1
BAG COUNTER SPONGE SURGICOUNT (BAG) ×1 IMPLANT
BAG SPNG CNTER NS LX DISP (BAG) ×2
BASKET BONE COLLECTION (BASKET) ×1 IMPLANT
BENZOIN TINCTURE PRP APPL 2/3 (GAUZE/BANDAGES/DRESSINGS) ×1 IMPLANT
BLADE CLIPPER SURG (BLADE) IMPLANT
BUR MATCHSTICK NEURO 3.0 LAGG (BURR) ×1 IMPLANT
BUR PRECISION FLUTE 6.0 (BURR) ×1 IMPLANT
CAGE ALTERA 10X31X9-13 15D (Cage) IMPLANT
CANISTER SUCT 3000ML PPV (MISCELLANEOUS) ×1 IMPLANT
CAP LOCK DLX THRD (Cap) IMPLANT
CNTNR URN SCR LID CUP LEK RST (MISCELLANEOUS) ×1 IMPLANT
CONT SPEC 4OZ STRL OR WHT (MISCELLANEOUS) ×1
COVER BACK TABLE 60X90IN (DRAPES) ×1 IMPLANT
DERMABOND ADVANCED .7 DNX12 (GAUZE/BANDAGES/DRESSINGS) IMPLANT
DRAPE C-ARM 42X72 X-RAY (DRAPES) ×2 IMPLANT
DRAPE HALF SHEET 40X57 (DRAPES) ×1 IMPLANT
DRAPE LAPAROTOMY 100X72X124 (DRAPES) ×1 IMPLANT
DRAPE SURG 17X23 STRL (DRAPES) ×4 IMPLANT
DRSG OPSITE POSTOP 4X6 (GAUZE/BANDAGES/DRESSINGS) ×1 IMPLANT
ELECT BLADE 4.0 EZ CLEAN MEGAD (MISCELLANEOUS) ×1
ELECT REM PT RETURN 9FT ADLT (ELECTROSURGICAL) ×1
ELECTRODE BLDE 4.0 EZ CLN MEGD (MISCELLANEOUS) ×1 IMPLANT
ELECTRODE REM PT RTRN 9FT ADLT (ELECTROSURGICAL) ×1 IMPLANT
EVACUATOR 1/8 PVC DRAIN (DRAIN) IMPLANT
GAUZE 4X4 16PLY ~~LOC~~+RFID DBL (SPONGE) ×1 IMPLANT
GLOVE BIO SURGEON STRL SZ 6 (GLOVE) ×1 IMPLANT
GLOVE BIO SURGEON STRL SZ8 (GLOVE) ×2 IMPLANT
GLOVE BIO SURGEON STRL SZ8.5 (GLOVE) ×2 IMPLANT
GLOVE BIOGEL PI IND STRL 6.5 (GLOVE) ×1 IMPLANT
GLOVE BIOGEL PI IND STRL 7.5 (GLOVE) IMPLANT
GLOVE EXAM NITRILE XL STR (GLOVE) IMPLANT
GOWN STRL REUS W/ TWL LRG LVL3 (GOWN DISPOSABLE) ×1 IMPLANT
GOWN STRL REUS W/ TWL XL LVL3 (GOWN DISPOSABLE) ×2 IMPLANT
GOWN STRL REUS W/TWL 2XL LVL3 (GOWN DISPOSABLE) IMPLANT
GOWN STRL REUS W/TWL LRG LVL3 (GOWN DISPOSABLE) ×1
GOWN STRL REUS W/TWL XL LVL3 (GOWN DISPOSABLE) ×2
HEMOSTAT POWDER KIT SURGIFOAM (HEMOSTASIS) ×1 IMPLANT
KIT BASIN OR (CUSTOM PROCEDURE TRAY) ×1 IMPLANT
KIT GRAFTMAG DEL NEURO DISP (NEUROSURGERY SUPPLIES) IMPLANT
KIT TURNOVER KIT B (KITS) ×1 IMPLANT
NDL HYPO 21X1.5 SAFETY (NEEDLE) IMPLANT
NEEDLE HYPO 21X1.5 SAFETY (NEEDLE) ×1 IMPLANT
NEEDLE HYPO 22GX1.5 SAFETY (NEEDLE) ×1 IMPLANT
NS IRRIG 1000ML POUR BTL (IV SOLUTION) ×1 IMPLANT
PACK LAMINECTOMY NEURO (CUSTOM PROCEDURE TRAY) ×1 IMPLANT
PAD ARMBOARD 7.5X6 YLW CONV (MISCELLANEOUS) ×3 IMPLANT
PATTIES SURGICAL .5 X1 (DISPOSABLE) IMPLANT
PUTTY DBM 10CC CALC GRAN (Putty) IMPLANT
ROD CREO DLX CVD 6.35X40 (Rod) IMPLANT
ROD CURVED TI 6.35X40 (Rod) ×2 IMPLANT
SCREW PA DLX CREO 7.5X55 (Screw) IMPLANT
SPIKE FLUID TRANSFER (MISCELLANEOUS) ×1 IMPLANT
SPONGE NEURO XRAY DETECT 1X3 (DISPOSABLE) IMPLANT
SPONGE SURGIFOAM ABS GEL 100 (HEMOSTASIS) IMPLANT
SPONGE T-LAP 4X18 ~~LOC~~+RFID (SPONGE) IMPLANT
STRIP CLOSURE SKIN 1/2X4 (GAUZE/BANDAGES/DRESSINGS) ×1 IMPLANT
SUT VIC AB 1 CT1 18XBRD ANBCTR (SUTURE) ×2 IMPLANT
SUT VIC AB 1 CT1 8-18 (SUTURE) ×3
SUT VIC AB 2-0 CP2 18 (SUTURE) ×2 IMPLANT
SYR 20ML LL LF (SYRINGE) IMPLANT
TOWEL GREEN STERILE (TOWEL DISPOSABLE) ×1 IMPLANT
TOWEL GREEN STERILE FF (TOWEL DISPOSABLE) ×1 IMPLANT
TRAY FOLEY MTR SLVR 16FR STAT (SET/KITS/TRAYS/PACK) ×1 IMPLANT
WATER STERILE IRR 1000ML POUR (IV SOLUTION) ×1 IMPLANT

## 2022-04-24 NOTE — Progress Notes (Signed)
Patient alert and oriented, mae's well, voiding adequate amount of urine, swallowing without difficulty, no c/o pain at time of discharge. Patient discharged home with family. Script and discharged instructions given to patient. Patient and family stated understanding of instructions given. Patient has an appointment with Dr. Jenkins in 2 weeks 

## 2022-04-24 NOTE — Anesthesia Postprocedure Evaluation (Signed)
Anesthesia Post Note  Patient: Martin Velazquez  Procedure(s) Performed: POSTERIOR LUMBAR INTERBODY FUSION,INTERBODY PROSTHESIS,POSTERIOR INSTRUMENTATION, LUMBAR FOUR- LUMBAR FIVE     Patient location during evaluation: PACU Anesthesia Type: General Level of consciousness: awake and alert Pain management: pain level controlled Vital Signs Assessment: post-procedure vital signs reviewed and stable Respiratory status: spontaneous breathing, nonlabored ventilation, respiratory function stable and patient connected to nasal cannula oxygen Cardiovascular status: blood pressure returned to baseline and stable Postop Assessment: no apparent nausea or vomiting Anesthetic complications: no   No notable events documented.  Last Vitals:  Vitals:   04/24/22 1240 04/24/22 1543  BP: (!) 146/67 (!) 150/62  Pulse: 64 63  Resp: 19 15  Temp: 36.9 C (!) 36.4 C  SpO2: 98% 95%    Last Pain:  Vitals:   04/24/22 1543  TempSrc: Oral  PainSc:                  [place] Lighter

## 2022-04-24 NOTE — H&P (Signed)
Subjective: The patient is a 52 year old black male on whom I previous performed an L5-S1 discectomy.  He has done well but recently developed increasing back and bilateral leg pain right greater than left.  He has failed medical management.  He is worked up with lumbar x-rays and lumbar MRI which demonstrated L4-5 facet arthropathy, spondylolisthesis, stenosis, etc.  I discussed the various treatment options with him.  He has decided to proceed with surgery.  I  Past Medical History:  Diagnosis Date   Back pain    herniated disc radiculopathy and lumbago   Eczema    around nose occasionally   Sleep apnea    CPAP(doesn't use);sleep study done 3+yrs ago    Past Surgical History:  Procedure Laterality Date   APPENDECTOMY  63yrs ago   [place]  2018   c5-c6, c6-c7   COLONOSCOPY     LUMBAR LAMINECTOMY/DECOMPRESSION MICRODISCECTOMY N/A 07/25/2012   Procedure: Lumbar five-sacral one diskectomy;  Surgeon: Ophelia Charter, MD;  Location: [facility];  Service: Neurosurgery;  Laterality: N/A;  Lumbar five-sacral one diskectomy   right ear surgery  at age 5    No Known Allergies  Social History   Tobacco Use   Smoking status: Never   Smokeless tobacco: Never  Substance Use Topics   Alcohol use: Yes    Alcohol/week: 1.0 standard drink of alcohol    Types: 1 Standard drinks or equivalent per week    Family History  Problem Relation Age of Onset   Cancer Mother    Prior to Admission medications   Medication Sig Start Date End Date Taking? Authorizing Provider  Multiple Vitamins-Minerals (MULTIVITAMIN WITH MINERALS) tablet Take 1 tablet by mouth in the morning and at bedtime.   Yes [provider]  TURMERIC PO Take 1 tablet by mouth 2 (two) times a week.   Yes [provider]  ibuprofen (ADVIL) 800 MG tablet Take 800 mg by mouth every 8 (eight) hours as needed for moderate pain or mild pain.    [provider]     Review of  Systems  Positive ROS: As above  All other systems have been reviewed and were otherwise negative with the exception of those mentioned in the HPI and as above.  Objective: Vital signs in last 24 hours: Temp:  [98.6 F (37 C)] 98.6 F (37 C) (02/05 0617) Pulse Rate:  [54] 54 (02/05 0617) Resp:  [18] 18 (02/05 0617) BP: (154)/(91) 154/91 (02/05 0617) SpO2:  [100 %] 100 % (02/05 0617) Weight:  [97.5 kg] 97.5 kg (02/05 0617) Estimated body mass index is 29.16 kg/m as calculated from the following:   Height as of this encounter: 6' (1.829 m).   Weight as of this encounter: 97.5 kg.   General Appearance: Alert Head: Normocephalic, without obvious abnormality, atraumatic Eyes: PERRL, conjunctiva/corneas clear, EOM's intact,    Ears: Normal  Throat: Normal  Neck: His cervical incision is well-healed. Back: His lumbar incision is well-healed. Lungs: Clear to auscultation bilaterally, respirations unlabored Heart: Regular rate and rhythm, no murmur, rub or gallop Abdomen: Soft, non-tender Extremities: Extremities normal, atraumatic, no cyanosis or edema Skin: unremarkable  NEUROLOGIC:   Mental status: alert and oriented,Motor Exam - grossly normal Sensory Exam - grossly normal Reflexes:  Coordination - grossly normal Gait - grossly normal Balance - grossly normal Cranial Nerves: I: smell Not tested  II: visual acuity  OS: Normal  OD: Normal   II: visual fields Full to confrontation  II:  pupils Equal, round, reactive to light  III,VII: ptosis None  III,IV,VI: extraocular muscles  Full ROM  V: mastication Normal  V: facial light touch sensation  Normal  V,VII: corneal reflex  Present  VII: facial muscle function - upper  Normal  VII: facial muscle function - lower Normal  VIII: hearing Not tested  IX: soft palate elevation  Normal  IX,X: gag reflex Present  XI: trapezius strength  5/5  XI: sternocleidomastoid strength 5/5  XI: neck flexion strength  5/5  XII: tongue  strength  Normal    Data Review Lab Results  Component Value Date   WBC 7.4 04/18/2022   HGB 14.0 04/18/2022   HCT 41.8 04/18/2022   MCV 91.7 04/18/2022   PLT 296 04/18/2022   Lab Results  Component Value Date   NA 138 07/24/2012   K 4.2 07/24/2012   CL 101 07/24/2012   CO2 30 07/24/2012   BUN 22 07/24/2012   CREATININE 1.15 07/24/2012   GLUCOSE 83 07/24/2012   No results found for: "INR", "PROTIME"  Assessment/Plan: Lumbar spinal stasis, lumbar facet arthropathy, lumbar spinal stenosis, lumbago, lumbar radiculopathy: I have discussed the situation with the patient.  I have reviewed his imaging studies with him and pointed out the abnormalities.  We have discussed the various treatment options including surgery.  I have described the surgical treatment option of a a posterior L4-5 decompression, instrumentation and fusion.  I have shown him surgical models.  I have given him a surgical pamphlet.  We have discussed risk, benefits, alternatives, and expected postoperative course.  I have answered all his questions.  He has decided proceed with surgery.   Ophelia Charter 04/24/2022 7:30 AM

## 2022-04-24 NOTE — Op Note (Signed)
Brief history: The patient is a 52 year old black male who has had previous lumbar surgery.  He has developed persistent and worsening back and leg pain consistent with neurogenic claudication/lumbar radiculopathy.  He failed medical management and was worked up with lumbar x-rays lumbar MRI which demonstrated an L4-5 spondylolisthesis, facet arthropathy and spinal stenosis.  I discussed the various treatment options with him.  He has decided proceed with surgery.  Preoperative diagnosis: L4-5 spondylolisthesis, facet arthropathy, degenerative disc disease, spinal stenosis compressing both the L4 and the L5 nerve roots; lumbago; lumbar radiculopathy; neurogenic claudication  Postoperative diagnosis: The same  Procedure: Bilateral L4-5 laminotomy/foraminotomies/medial facetectomy to decompress the bilateral L4 and L5 nerve roots(the work required to do this was in addition to the work required to do the posterior lumbar interbody fusion because of the patient's facet arthropathy, spinal stenosis, facet arthropathy. Etc. requiring a wide decompression of the nerve roots.);  Right L4-5 transforaminal lumbar interbody fusion with local morselized autograft bone and Zimmer DBM; insertion of interbody prosthesis at L4-5 (globus peek expandable interbody prosthesis); posterior nonsegmental instrumentation from L4 to L5 with globus titanium pedicle screws and rods; posterior lateral arthrodesis at L4-5 with local morselized autograft bone and Zimmer DBM.  Surgeon: Dr. Earle Gell  Asst.: Arnetha Massy, NP  Anesthesia: Gen. endotracheal  Estimated blood loss: 200 cc  Drains: None  Complications: None  Description of procedure: The patient was brought to the operating room by the anesthesia team. General endotracheal anesthesia was induced. The patient was turned to the prone position on the Wilson frame. The patient's lumbosacral region was then prepared with Betadine scrub and Betadine solution. Sterile  drapes were applied.  I then injected the area to be incised with Marcaine with epinephrine solution. I then used the scalpel to make a linear midline incision over the L4-5 and L5-S1 interspace, incising through his old surgical scar. I then used electrocautery to perform a bilateral subperiosteal dissection exposing the spinous process and lamina of L4-5. We then obtained intraoperative radiograph to confirm our location. We then inserted the Verstrac retractor to provide exposure.  I began the decompression by using the high speed drill to perform laminotomies at L4-5 bilaterally. We then used the Kerrison punches to widen the laminotomy and removed the ligamentum flavum at L4-5 bilaterally. We used the Kerrison punches to remove the medial facets at L4-5 bilaterally, we removed the right L4-5 facet. We performed wide foraminotomies about the bilateral L4 and L5 nerve roots completing the decompression.  We now turned our attention to the posterior lumbar interbody fusion. I used a scalpel to incise the intervertebral disc at L4-5 bilaterally. I then performed a partial intervertebral discectomy at L4-5 bilaterally using the pituitary forceps. We prepared the vertebral endplates at M0-1 bilaterally for the fusion by removing the soft tissues with the curettes. We then used the trial spacers to pick the appropriate sized interbody prosthesis. We prefilled his prosthesis with a combination of local morselized autograft bone that we obtained during the decompression as well as Zimmer DBM. We inserted the prefilled prosthesis into the interspace at L4-5 from the right, we then turned and expanded the prosthesis. There was a good snug fit of the prosthesis in the interspace. We then filled and the remainder of the intervertebral disc space with local morselized autograft bone and Zimmer DBM. This completed the posterior lumbar interbody arthrodesis.  During the decompression and insertion of the prosthesis the  assistant protected the thecal sac and nerve roots with the D'Errico  retractor.  We now turned attention to the instrumentation. Under fluoroscopic guidance we cannulated the bilateral L4 and L5 pedicles with the bone probe. We then removed the bone probe. We then tapped the pedicle with a 6.5 millimeter tap. We then removed the tap. We probed inside the tapped pedicle with a ball probe to rule out cortical breaches. We then inserted a 7.5 x 55 millimeter pedicle screw into the L4 and L5 pedicles bilaterally under fluoroscopic guidance. We then palpated along the medial aspect of the pedicles to rule out cortical breaches. There were none. The nerve roots were not injured. We then connected the unilateral pedicle screws with a lordotic rod. We compressed the construct and secured the rod in place with the caps. We then tightened the caps appropriately. This completed the instrumentation from L4-5 bilaterally.  We now turned our attention to the posterior lateral arthrodesis at L4-5. We used the high-speed drill to decorticate the remainder of the facets, pars, transverse process at L4-5. We then applied a combination of local morselized autograft bone and Zimmer DBM over these decorticated posterior lateral structures. This completed the posterior lateral arthrodesis.  We then obtained hemostasis using bipolar electrocautery. We irrigated the wound out with bacitracin solution. We inspected the thecal sac and nerve roots and noted they were well decompressed. We then removed the retractor.  We injected Exparel . We reapproximated patient's thoracolumbar fascia with interrupted #1 Vicryl suture. We reapproximated patient's subcutaneous tissue with interrupted 2-0 Vicryl suture. The reapproximated patient's skin with Steri-Strips and benzoin. The wound was then coated with bacitracin ointment. A sterile dressing was applied. The drapes were removed. The patient was subsequently returned to the supine position  where they were extubated by the anesthesia team. He was then transported to the post anesthesia care unit in stable condition. All sponge instrument and needle counts were reportedly correct at the end of this case.

## 2022-04-24 NOTE — Evaluation (Signed)
Occupational Therapy Evaluation Patient Details Name: Martin Velazquez MRN: [id number] DOB: 03/08/1971 Today's Date: 04/24/2022   History of Present Illness 52 y.o. male presents to [facility] hospital on 04/24/2022 for L4-5 decompression and PLIF. PMH includes back pain, eczema, OSA.   Clinical Impression   Patient admitted for the diagnosis above.  PTA he lives at home with his spouse, works full time, and needed no assist with ADL, iADL or mobility.  Patient is experiencing an appropriate amount of post op discomfort, but remains largely independent with mobility and ADL completion.  All precautions reviewed, questions answered, and no further needs in the acute setting.  Recommend follow up as prescribed by MD.         Recommendations for follow up therapy are one component of a multi-disciplinary discharge planning process, led by the attending physician.  Recommendations may be updated based on patient status, additional functional criteria and insurance authorization.   Follow Up Recommendations  No OT follow up     Assistance Recommended at Discharge PRN  Patient can return home with the following Assist for transportation    Functional Status Assessment  Patient has not had a recent decline in their functional status  Equipment Recommendations  None recommended by OT    Recommendations for Other Services       Precautions / Restrictions Precautions Precautions: Back Precaution Booklet Issued: Yes (comment) Required Braces or Orthoses: Spinal Brace Spinal Brace: Lumbar corset;Applied in sitting position;Applied in standing position Restrictions Weight Bearing Restrictions: No      Mobility Bed Mobility               General bed mobility comments: sitting EOB    Transfers Overall transfer level: Modified independent Equipment used: None               General transfer comment: reaching for objects in his environment      Balance Overall balance assessment:  Independent                                         ADL either performed or assessed with clinical judgement   ADL Overall ADL's : At baseline                                             Vision Baseline Vision/History: 0 No visual deficits Patient Visual Report: No change from baseline       Perception     Praxis      Pertinent Vitals/Pain Pain Assessment Pain Assessment: Faces Faces Pain Scale: Hurts little more Pain Location: back Pain Descriptors / Indicators: Tender, Grimacing Pain Intervention(s): Monitored during session     Hand Dominance Right   Extremity/Trunk Assessment Upper Extremity Assessment Upper Extremity Assessment: Overall WFL for tasks assessed   Lower Extremity Assessment Lower Extremity Assessment: Defer to PT evaluation   Cervical / Trunk Assessment Cervical / Trunk Assessment: Back Surgery   Communication Communication Communication: No difficulties   Cognition Arousal/Alertness: Awake/alert Behavior During Therapy: WFL for tasks assessed/performed Overall Cognitive Status: Within Functional Limits for tasks assessed  General Comments  VSS on RA    Exercises     Shoulder Instructions      Home Living Family/patient expects to be discharged to:: Private residence Living Arrangements: Spouse/significant other Available Help at Discharge: Family;Available PRN/intermittently Type of Home: House Home Access: Stairs to enter [employer] of Steps: 3 Entrance Stairs-Rails: Left Home Layout: Multi-level;Able to live on main level with bedroom/bathroom     Bathroom Shower/Tub: Occupational psychologist: Standard     Home Equipment: None          Prior Functioning/Environment Prior Level of Function : Independent/Modified Independent;Working/employed;Driving             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 [employer] ADLs Comments: no assist with any aspect of ADL, iADL        OT Problem List: Pain      OT Treatment/Interventions:      OT Goals(Current goals can be found in the care plan section) Acute Rehab OT Goals Patient Stated Goal: Return home OT Goal Formulation: With patient Time For Goal Achievement: 04/28/22 Potential to Achieve Goals: Good  OT Frequency:      Co-evaluation              AM-PAC OT "6 Clicks" Daily Activity     Outcome Measure Help from another person eating meals?: None Help from another person taking care of personal grooming?: None Help from another person toileting, which includes using toliet, bedpan, or urinal?: None Help from another person bathing (including washing, rinsing, drying)?: None Help from another person to put on and taking off regular upper body clothing?: None Help from another person to put on and taking off regular lower body clothing?: None 6 Click Score: 24   End of Session Nurse Communication: Mobility status  Activity Tolerance: Patient tolerated treatment well Patient left: in bed;with call bell/phone within reach  OT Visit Diagnosis: Pain Pain - part of body:  (incisional)                Time: 4174-0814 OT Time Calculation (min): 21 min Charges:  OT General Charges $OT Visit: 1 Visit OT Evaluation $OT Eval Moderate Complexity: 1 Mod  04/24/2022  RP, OTR/L  Acute Rehabilitation Services  Office:  [phone]   Metta Clines 04/24/2022, 5:25 PM

## 2022-04-24 NOTE — Transfer of Care (Signed)
Immediate Anesthesia Transfer of Care Note  Patient: Martin Velazquez  Procedure(s) Performed: POSTERIOR LUMBAR INTERBODY FUSION,INTERBODY PROSTHESIS,POSTERIOR INSTRUMENTATION, LUMBAR FOUR- LUMBAR FIVE  Patient Location: PACU  Anesthesia Type:General  Level of Consciousness: awake  Airway & Oxygen Therapy: Patient Spontanous Breathing and Patient connected to face mask oxygen  Post-op Assessment: Report given to RN and Post -op Vital signs reviewed and stable  Post vital signs: Reviewed and stable  Last Vitals:  Vitals Value Taken Time  BP    Temp    Pulse 78 04/24/22 1138  Resp    SpO2 98 % 04/24/22 1138  Vitals shown include unvalidated device data.  Last Pain:  Vitals:   04/24/22 0617  TempSrc: Oral  PainSc:          Complications: No notable events documented.

## 2022-04-24 NOTE — Evaluation (Signed)
Physical Therapy Evaluation Patient Details Name: Martin Velazquez MRN: [id number] DOB: 1970-08-27 Today's Date: 04/24/2022  History of Present Illness  52 y.o. male presents to [facility] hospital on 04/24/2022 for L4-5 decompression and PLIF. PMH includes back pain, eczema, OSA.  Clinical Impression  Pt presents to PT with deficits in functional mobility, gait, power, and endurance compared to baseline, however he is able to mobilize independently at this time. PT provides education on back precautions and stair negotiation technique. Pt is mobilizing well at this time and is encouraged to move frequently at the time of discharge. PT recommends discharge home when medically ready. No imminent need for post-acute PT services although pt may benefit from outpatient sports PT prior to returning to work due to physical demands of his job.     Recommendations for follow up therapy are one component of a multi-disciplinary discharge planning process, led by the attending physician.  Recommendations may be updated based on patient status, additional functional criteria and insurance authorization.  Follow Up Recommendations No PT follow up (may benefit from outpatient sports PT prior to return to work due to physically demanding job)      Assistance Recommended at Discharge PRN  Patient can return home with the following  A little help with bathing/dressing/bathroom    Equipment Recommendations None recommended by PT  Recommendations for Other Services       Functional Status Assessment Patient has had a recent decline in their functional status and demonstrates the ability to make significant improvements in function in a reasonable and predictable amount of time.     Precautions / Restrictions Precautions Precautions: Back Precaution Booklet Issued: Yes (comment) Required Braces or Orthoses: Spinal Brace Spinal Brace: Lumbar corset;Applied in sitting position Restrictions Weight Bearing  Restrictions: No      Mobility  Bed Mobility Overal bed mobility: Modified Independent             General bed mobility comments: sidelying to sitting modI, increased time    Transfers Overall transfer level: Modified independent Equipment used: None               General transfer comment: increased time    Ambulation/Gait Ambulation/Gait assistance: Independent Gait Distance (Feet): 150 Feet Assistive device: None Gait Pattern/deviations: Step-through pattern Gait velocity: reduced Gait velocity interpretation: 1.31 - 2.62 ft/sec, indicative of limited community ambulator   General Gait Details: slowed step-through gait  Stairs Stairs: Yes Stairs assistance: Modified independent (Device/Increase time) Stair Management: One rail Left, Alternating pattern Number of Stairs: 8 General stair comments: slowed ascent/descent  Wheelchair Mobility    Modified Rankin (Stroke Patients Only)       Balance Overall balance assessment: Independent                                           Pertinent Vitals/Pain Pain Assessment Pain Assessment: Faces Faces Pain Scale: Hurts little more Pain Location: back Pain Descriptors / Indicators: Sore Pain Intervention(s): Monitored during session    Home Living Family/patient expects to be discharged to:: Private residence Living Arrangements: Spouse/significant other Available Help at Discharge: Family;Available PRN/intermittently Type of Home: House Home Access: Stairs to enter Entrance Stairs-Rails: Left Entrance Stairs-Number of Steps: 3   Home Layout: Multi-level;Able to live on main level with bedroom/bathroom Home Equipment: None      Prior Function Prior Level of Function : Independent/Modified Independent;Working/employed;Driving  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[place] Upper Extremity Assessment: Overall WFL for tasks assessed    Lower Extremity Assessment Lower Extremity Assessment: Overall WFL for tasks assessed    Cervical / Trunk Assessment Cervical / Trunk Assessment: Back Surgery  Communication   Communication: No difficulties  Cognition Arousal/Alertness: Awake/alert Behavior During Therapy: WFL for tasks assessed/performed Overall Cognitive Status: Within Functional Limits for tasks assessed                                          General Comments General comments (skin integrity, edema, etc.): VSS on RA    Exercises     Assessment/Plan    PT Assessment All further PT needs can be met in the next venue of care  PT Problem List Decreased activity tolerance;Pain       PT Treatment Interventions      PT Goals (Current goals can be found in the Care Plan section)       Frequency       Co-evaluation               AM-PAC PT "6 Clicks" Mobility  Outcome Measure Help needed turning from your back to your side while in a flat bed without using bedrails?: None Help needed moving from lying on your back to sitting on the side of a flat bed without using bedrails?: None Help needed moving to and from a bed to a chair (including a wheelchair)?: None Help needed standing up from a chair using your arms (e.g., wheelchair or bedside chair)?: None Help needed to walk in hospital room?: None Help needed climbing 3-5 steps with a railing? : None 6 Click Score: 24    End of Session Equipment Utilized During Treatment: Back brace Activity Tolerance: Patient tolerated treatment well Patient left: in bed;with call bell/phone within reach Nurse Communication: Mobility status PT Visit Diagnosis: Other abnormalities of gait and mobility (R26.89)    Time: 5102-5852 PT Time Calculation (min) (ACUTE ONLY): 14 min   Charges:   PT Evaluation $PT Eval Low Complexity: [place], PT, DPT Acute Rehabilitation Office [phone]   Zenaida Niece 04/24/2022, 5:00 PM

## 2022-04-24 NOTE — Discharge Instructions (Addendum)
Wound Care Keep incision covered and dry for three days. You can remove Honeycomb dressing on the third day post op.  Do not put any creams, lotions, or ointments on incision. Leave steri-strips on back.  They will fall off by themselves. You are fine to shower. Let water run over incision and pat dry.  Activity Walk each and every day, increasing distance each day. No lifting greater than 5 lbs.  Avoid excessive back motion. No driving for 2 weeks; may ride as a passenger locally.  Diet Resume your normal diet.   Return to Work Will be discussed at your follow up appointment.  Call Your Doctor If Any of These Occur Redness, drainage, or swelling at the wound.  Temperature greater than 101 degrees. Severe pain not relieved by pain medication. Incision starts to come apart.  Follow Up Appt Call [phone] If you have any problems.  If you have any hardware placed in your spine, you will need an x-ray before your appointment.

## 2022-04-24 NOTE — Progress Notes (Signed)
Orthopedic Tech Progress Note Patient Details:  Martin Velazquez 22-Sep-1970 [id number]  Ortho Devices Type of Ortho Device: Lumbar corsett Ortho Device/Splint Location: BACK Ortho Device/Splint Interventions: Ordered   Post Interventions Patient Tolerated: Well Instructions Provided: Care of [place] 04/24/2022, 12:43 PM

## 2022-04-24 NOTE — Discharge Summary (Signed)
Physician Discharge Summary     Providing Compassionate, Quality Care - Together   Patient ID: Martin Velazquez MRN: [id number] DOB/AGE: 05/20/1970 52 y.o.  Admit date: 04/24/2022 Discharge date: 04/24/2022  Admission Diagnoses: Spondylolisthesis of lumbar region  Discharge Diagnoses:  Principal Problem:   Spondylolisthesis of lumbar region   Discharged Condition: good  Hospital Course: Patient underwent an L4-5 PLIF by Dr. Arnoldo Morale on 04/24/2022. He was admitted to 3C02 following recovery from anesthesia in the PACU. His postoperative course has been uncomplicated. He is ambulating independently and without difficulty. He is tolerating a normal diet. He is not having any bowel or bladder dysfunction. His pain is well-controlled with oral pain medication. He is ready for discharge home.   Consults: None  Significant Diagnostic Studies: radiology: DG Lumbar Spine 2-3 Views  Result Date: 04/24/2022 CLINICAL DATA:  Elective surgery. EXAM: LUMBAR SPINE - 2-3 VIEW COMPARISON:  Preoperative imaging demonstrating 5 non-rib-bearing lumbar vertebra. FINDINGS: Two fluoroscopic spot views obtained in the operating room in frontal projection. Intrapedicular screws at L4 and L5 with interbody spacer in place. Fluoroscopy time 20 seconds. Dose 15.89 mGy. IMPRESSION: Intraoperative fluoroscopy during lumbar fusion. Electronically Signed   By: Keith Rake M.D.   On: 04/24/2022 11:08   DG C-Arm 1-60 Min-No Report  Result Date: 04/24/2022 Fluoroscopy was utilized by the requesting physician.  No radiographic interpretation.   DG Lumbar Spine 1 View  Result Date: 04/24/2022 CLINICAL DATA:  Intraoperative localization. EXAM: LUMBAR SPINE - 1 VIEW COMPARISON:  03/23/2020 and MR lumbar spine 08/19/2021. FINDINGS: Single intraoperative cross-table lateral view of the lumbar spine is submitted. Outside comparison imaging does not provide numeric labeling on the images. There appear to be 5 lumbar type  vertebral bodies. Surgical instrument tip projects posterior to L3-4 facet joints. L5-S1 degenerative disc disease. IMPRESSION: 1. Surgical instrument tip projects posterior to the L3-4 facet joints. 2. L5-S1 degenerative disc disease. Electronically Signed   By: Lorin Picket M.D.   On: 04/24/2022 10:51     Treatments: surgery: Bilateral L4-5 laminotomy/foraminotomies/medial facetectomy to decompress the bilateral L4 and L5 nerve roots(the work required to do this was in addition to the work required to do the posterior lumbar interbody fusion because of the patient's facet arthropathy, spinal stenosis, facet arthropathy. Etc. requiring a wide decompression of the nerve roots.);  Right L4-5 transforaminal lumbar interbody fusion with local morselized autograft bone and Zimmer DBM; insertion of interbody prosthesis at L4-5 (globus peek expandable interbody prosthesis); posterior nonsegmental instrumentation from L4 to L5 with globus titanium pedicle screws and rods; posterior lateral arthrodesis at L4-5 with local morselized autograft bone and Zimmer DBM.   Discharge Exam: Blood pressure (!) 150/62, pulse 63, temperature (!) 97.5 F (36.4 C), temperature source Oral, resp. rate 15, height 6' (1.829 m), weight 97.5 kg, SpO2 95 %.  Alert and oriented x 4 PERRLA CN II-XII grossly intact MAE, Strength and sensation intact Incision is covered with Honeycomb dressing and Steri Strips; Dressing is clean, dry, and intact  Disposition: Discharge disposition: 01-Home or Self Care       Discharge Instructions     Call MD for:  difficulty breathing, headache or visual disturbances   Complete by: As directed    Call MD for:  hives   Complete by: As directed    Call MD for:  persistant nausea and vomiting   Complete by: As directed    Call MD for:  redness, tenderness, or signs of infection (pain, swelling, redness, odor or  green/yellow discharge around incision site)   Complete by: As directed     Call MD for:  severe uncontrolled pain   Complete by: As directed    Call MD for:  temperature >100.4   Complete by: As directed    Diet - low sodium heart healthy   Complete by: As directed    If the dressing is still on your incision site when you go home, remove it on the third day after your surgery date. Remove dressing if it begins to fall off, or if it is dirty or damaged before the third day.   Complete by: As directed    Increase activity slowly   Complete by: As directed    No wound care   Complete by: As directed       Allergies as of 04/24/2022   No Known Allergies      Medication List     STOP taking these medications    ibuprofen 800 MG tablet Commonly known as: ADVIL       TAKE these medications    cyclobenzaprine 10 MG tablet Commonly known as: FLEXERIL Take 1 tablet (10 mg total) by mouth 3 (three) times daily as needed for muscle spasms.   docusate sodium 100 MG capsule Commonly known as: COLACE Take 1 capsule (100 mg total) by mouth 2 (two) times daily.   multivitamin with minerals tablet Take 1 tablet by mouth in the morning and at bedtime.   oxyCODONE-acetaminophen 5-325 MG tablet Commonly known as: Percocet Take 1 tablet by mouth every 4 (four) hours as needed for severe pain.   TURMERIC PO Take 1 tablet by mouth 2 (two) times a week.               Discharge Care Instructions  (From admission, onward)           Start     Ordered   04/24/22 0000  If the dressing is still on your incision site when you go home, remove it on the third day after your surgery date. Remove dressing if it begins to fall off, or if it is dirty or damaged before the third day.        04/24/22 1615            Follow-up Information     Newman Pies, MD. Go on 05/16/2022.   Specialty: Neurosurgery Why: First post op appointment with x-rays is on 05/16/2022 at 1:45 PM. Contact information: [address] [place]  [address] [phone]                 Signed: Viona Gilmore, DNP, AGNP-C Nurse Practitioner  [facility] [place] [address], [place], [place] [address] P: [phone]    F: [phone]  04/24/2022, 4:17 PM

## 2022-04-24 NOTE — Anesthesia Procedure Notes (Signed)
Procedure Name: Intubation Date/Time: 04/24/2022 7:46 AM  Performed by: Lieutenant Diego, CRNAPre-anesthesia Checklist: Patient identified, Emergency Drugs available, Suction available and Patient being monitored Patient Re-evaluated:Patient Re-evaluated prior to induction Oxygen Delivery Method: Circle system utilized Preoxygenation: Pre-oxygenation with 100% oxygen Induction Type: IV induction Ventilation: Mask ventilation without difficulty Laryngoscope Size: Glidescope Grade View: Grade I Tube type: Oral Tube size: 7.5 mm Number of attempts: 1 Airway Equipment and Method: Stylet and Video-laryngoscopy Placement Confirmation: ETT inserted through vocal cords under direct vision, positive ETCO2 and breath sounds checked- equal and bilateral Secured at: 22 cm Tube secured with: Tape Dental Injury: Teeth and Oropharynx as per pre-operative assessment

## 2022-04-28 MED FILL — Sodium Chloride IV Soln 0.9%: INTRAVENOUS | Qty: 1000 | Status: AC

## 2022-04-28 MED FILL — Heparin Sodium (Porcine) Inj 1000 Unit/ML: INTRAMUSCULAR | Qty: 30 | Status: AC

## 2022-10-03 ENCOUNTER — Encounter: Payer: Self-pay | Admitting: Gastroenterology

## 2022-10-20 ENCOUNTER — Encounter: Payer: 59 | Admitting: Gastroenterology

## 2022-11-10 ENCOUNTER — Encounter: Payer: Self-pay | Admitting: Gastroenterology

## 2023-03-27 ENCOUNTER — Ambulatory Visit (AMBULATORY_SURGERY_CENTER): Payer: 59 | Admitting: *Deleted

## 2023-03-27 ENCOUNTER — Telehealth: Payer: Self-pay

## 2023-03-27 VITALS — Ht 72.0 in | Wt 215.0 lb

## 2023-03-27 DIAGNOSIS — Z1211 Encounter for screening for malignant neoplasm of colon: Secondary | ICD-10-CM

## 2023-03-27 MED ORDER — NA SULFATE-K SULFATE-MG SULF 17.5-3.13-1.6 GM/177ML PO SOLN: 1.0000 | Freq: Once | ORAL | 0 refills | Status: AC

## 2023-03-27 NOTE — Progress Notes (Signed)
 Pt's name and DOB verified at the beginning of the pre-visit wit 2 identifiers  Pt denies any difficulty with ambulating,sitting, laying down or rolling side to side  Pt has no issues with ambulation   Pt has no issues moving head neck or swallowing  No egg or soy allergy known to patient   No issues known to pt with past sedation with any surgeries or procedures  Pt denies having issues being intubated  No FH of Malignant Hyperthermia  Pt is not on diet pills or shots  Pt is not on home 02   Pt is not on blood thinners   Pt denies issues with constipation   Pt is not on dialysis  Pt denise any abnormal heart rhythms   Pt denies any upcoming cardiac testing  Pt encouraged to use to use Singlecare or Goodrx to reduce cost   Patient's chart reviewed by Cathlyn Parsons CNRA prior to pre-visit and patient appropriate for the [facility].  Pre-visit completed and red dot placed by patient's name on their procedure day (on provider's schedule).  .  Visit by phone   Pt states weight is 215 lb  Instructed pt why it is important to and  to call if they have any changes in health or new medications. Directed them to the # given and on instructions.     Instructions reviewed. Pt given both [facility] main # and MD on call # prior to instructions.  Pt states understanding. Instructed to review again prior to procedure. Pt states they will.   Instructions sent by mail with coupon and by My Chart  Coupon sent via text to mobile phone and pt verified they received it

## 2023-03-27 NOTE — Telephone Encounter (Signed)
Multiple attempts made to reach patient- NO answer-message left for patient to call back to the office to reschedule PV appt- if patient fails to call back to the office prior to end of business day ---PV and procedure appts will be cancelled and a no show letter will be sent to the patient;

## 2023-04-05 ENCOUNTER — Other Ambulatory Visit: Payer: Self-pay | Admitting: Nurse Practitioner

## 2023-04-05 ENCOUNTER — Ambulatory Visit
Admission: RE | Admit: 2023-04-05 | Discharge: 2023-04-05 | Disposition: A | Discharge status: Home / Self Care | Payer: No Typology Code available for payment source | Source: Ambulatory Visit | Attending: Nurse Practitioner | Admitting: Nurse Practitioner

## 2023-04-05 DIAGNOSIS — Z0000 Encounter for general adult medical examination without abnormal findings: Secondary | ICD-10-CM

## 2023-04-12 ENCOUNTER — Other Ambulatory Visit: Payer: Self-pay | Admitting: Nurse Practitioner

## 2023-04-12 ENCOUNTER — Ambulatory Visit
Admission: RE | Admit: 2023-04-12 | Discharge: 2023-04-12 | Disposition: A | Discharge status: Home / Self Care | Payer: No Typology Code available for payment source | Source: Ambulatory Visit | Attending: Nurse Practitioner | Admitting: Nurse Practitioner

## 2023-04-12 DIAGNOSIS — R9389 Abnormal findings on diagnostic imaging of other specified body structures: Secondary | ICD-10-CM

## 2023-04-13 ENCOUNTER — Encounter: Payer: Self-pay | Admitting: Gastroenterology

## 2023-04-16 ENCOUNTER — Ambulatory Visit: Payer: 59 | Admitting: Gastroenterology

## 2023-04-16 ENCOUNTER — Encounter: Payer: Self-pay | Admitting: Gastroenterology

## 2023-04-16 VITALS — BP 122/74 | HR 48 | Temp 97.7°F | Resp 14

## 2023-04-16 DIAGNOSIS — K640 First degree hemorrhoids: Secondary | ICD-10-CM

## 2023-04-16 DIAGNOSIS — Z1211 Encounter for screening for malignant neoplasm of colon: Secondary | ICD-10-CM | POA: Diagnosis present

## 2023-04-16 DIAGNOSIS — D122 Benign neoplasm of ascending colon: Secondary | ICD-10-CM | POA: Diagnosis not present

## 2023-04-16 DIAGNOSIS — K635 Polyp of colon: Secondary | ICD-10-CM | POA: Diagnosis not present

## 2023-04-16 MED ORDER — SODIUM CHLORIDE 0.9 % IV SOLN: 500.0000 mL | Freq: Once | INTRAVENOUS | Status: DC

## 2023-04-16 NOTE — Progress Notes (Signed)
Called to room to assist during endoscopic procedure.  Patient ID and intended procedure confirmed with present staff. Received instructions for my participation in the procedure from the performing physician.

## 2023-04-16 NOTE — Progress Notes (Signed)
Report to PACU, RN, vss, BBS= Clear.

## 2023-04-16 NOTE — Progress Notes (Signed)
[facility] History and Physical   Primary Care Physician:  Patient, No Pcp Per   Reason for Procedure:  CRC screening  Plan:    Colonoscopy     HPI: Martin Velazquez is a 53 y.o. male    Past Medical History:  Diagnosis Date   Back pain    herniated disc radiculopathy and lumbago   Eczema    around nose occasionally   Sleep apnea    CPAP(doesn't use);sleep study done 3+yrs ago    Past Surgical History:  Procedure Laterality Date   APPENDECTOMY  45yrs ago   CERVICAL DISC ARTHROPLASTY  2018   c5-c6, c6-c7   COLONOSCOPY     LUMBAR LAMINECTOMY/DECOMPRESSION MICRODISCECTOMY N/A 07/25/2012   Procedure: Lumbar five-sacral one diskectomy;  Surgeon: Cristi Loron, MD;  Location: [facility];  Service: Neurosurgery;  Laterality: N/A;  Lumbar five-sacral one diskectomy   right ear surgery  at age 15    Prior to Admission medications   Medication Sig Start Date End Date Taking? Authorizing Provider  Multiple Vitamins-Minerals (MULTIVITAMIN WITH MINERALS) tablet Take 1 tablet by mouth in the morning and at bedtime.   Yes [provider]  TURMERIC PO Take 1 tablet by mouth 2 (two) times a week.   Yes [provider]    Current Outpatient Medications  Medication Sig Dispense Refill   Multiple Vitamins-Minerals (MULTIVITAMIN WITH MINERALS) tablet Take 1 tablet by mouth in the morning and at bedtime.     TURMERIC PO Take 1 tablet by mouth 2 (two) times a week.     Current Facility-Administered Medications  Medication Dose Route Frequency Provider Last Rate Last Admin   0.9 %  sodium chloride infusion  500 mL Intravenous Once Lynann Bologna, MD        Allergies as of 04/16/2023   (No Known Allergies)    Family History  Problem Relation Age of Onset   Cancer Mother    Colon polyps Neg Hx    Colon cancer Neg Hx    Esophageal cancer Neg Hx    Rectal cancer Neg Hx    Stomach cancer Neg Hx     Social History   Socioeconomic History    Marital status: Married    Spouse name: Not on file   Number of children: 2   Years of education: Not on file   Highest education level: Not on file  Occupational History   Not on file  Tobacco Use   Smoking status: Never   Smokeless tobacco: Never  Vaping Use   Vaping status: Never Used  Substance and Sexual Activity   Alcohol use: Yes    Alcohol/week: 1.0 standard drink of alcohol    Types: 1 Standard drinks or equivalent per week   Drug use: No   Sexual activity: Yes  Other Topics Concern   Not on file  Social History Narrative   Not on file   Social Drivers of Health   Financial Resource Strain: Not on file  Food Insecurity: Not on file  Transportation Needs: Not on file  Physical Activity: Not on file  Stress: Not on file  Social Connections: Unknown (08/02/2021)   Received from [facility], [facility]   Social Network    Social Network: Not on file  Intimate Partner Violence: Unknown (06/24/2021)   Received from [facility], [facility]   HITS    Physically Hurt: Not on file    Insult or Talk Down To: Not on file  Threaten Physical Harm: Not on file    Scream or Curse: Not on file    Review of Systems: Positive for none All other review of systems negative except as mentioned in the HPI.  Physical Exam: Vital signs in last 24 hours: @VSRANGES @   General:   Alert,  Well-developed, well-nourished, pleasant and cooperative in NAD Lungs:  Clear throughout to auscultation.   Heart:  Regular rate and rhythm; no murmurs, clicks, rubs,  or gallops. Abdomen:  Soft, nontender and nondistended. Normal bowel sounds.   Neuro/Psych:  Alert and cooperative. Normal mood and affect. A and O x 3    No significant changes were identified.  The patient continues to be an appropriate candidate for the planned procedure and anesthesia.   Edman Circle, MD. [facility] 04/16/2023 2:04 PM@

## 2023-04-16 NOTE — Op Note (Signed)
[facility] Patient Name: Esdras Delair Procedure Date: 04/16/2023 1:59 PM MRN: [id number] Endoscopist: Lynann Bologna , MD, [id number] Age: 53 Referring MD:  Date of Birth: 03-31-1970 Gender: Male Account #: [id number] Procedure:                Colonoscopy Indications:              Screening for colorectal malignant neoplasm Medicines:                Monitored Anesthesia Care Procedure:                Pre-Anesthesia Assessment:                           - Prior to the procedure, a History and Physical                            was performed, and patient medications and                            allergies were reviewed. The patient's tolerance of                            previous anesthesia was also reviewed. The risks                            and benefits of the procedure and the sedation                            options and risks were discussed with the patient.                            All questions were answered, and informed consent                            was obtained. Prior Anticoagulants: The patient has                            taken no anticoagulant or antiplatelet agents. ASA                            Grade Assessment: I - A normal, healthy patient.                            After reviewing the risks and benefits, the patient                            was deemed in satisfactory condition to undergo the                            procedure.                           After obtaining informed consent, the colonoscope  was passed under direct vision. Throughout the                            procedure, the patient's blood pressure, pulse, and                            oxygen saturations were monitored continuously. The                            Olympus Scope SN: [id number] was introduced through                            the anus and advanced to the 2 cm into the ileum.                            The colonoscopy was performed  without difficulty.                            The patient tolerated the procedure well. The                            quality of the bowel preparation was good. The                            terminal ileum, ileocecal valve, appendiceal                            orifice, and rectum were photographed. Scope In: 2:11:58 PM Scope Out: 2:27:50 PM Scope Withdrawal Time: 0 hours 11 minutes 54 seconds  Total Procedure Duration: 0 hours 15 minutes 52 seconds  Findings:                 A 4 mm polyp was found in the mid ascending colon.                            The polyp was sessile. The polyp was removed with a                            cold snare. Resection and retrieval were complete.                           Internal hemorrhoids were found during                            retroflexion. The hemorrhoids were mild and Grade I                            (internal hemorrhoids that do not prolapse).                           The terminal ileum appeared normal.                           The exam was otherwise  without abnormality on                            direct and retroflexion views. Complications:            No immediate complications. Estimated Blood Loss:     Estimated blood loss: none. Impression:               - One 4 mm polyp in the mid ascending colon,                            removed with a cold snare. Resected and retrieved.                           - Internal hemorrhoids.                           - The examined portion of the ileum was normal.                           - The examination was otherwise normal on direct                            and retroflexion views. Recommendation:           - Patient has a contact number available for                            emergencies. The signs and symptoms of potential                            delayed complications were discussed with the                            patient. Return to normal activities tomorrow.                             Written discharge instructions were provided to the                            patient.                           - Resume previous high-fiber diet.                           - Continue present medications.                           - Await pathology results.                           - Use Preparation H twice daily x 7 to 10 days as                            needed                           -  Repeat colonoscopy for surveillance based on                            pathology results.                           - The findings and recommendations were discussed                            with the patient's family. Lynann Bologna, MD 04/16/2023 2:32:20 PM This report has been signed electronically.

## 2023-04-16 NOTE — Patient Instructions (Signed)
-   Resume previous high-fiber diet. - Continue present medications. - Await pathology results. - Use Preparation H twice daily x 7 to 10 days as needed   YOU HAD AN ENDOSCOPIC PROCEDURE TODAY AT THE [facility]:   Refer to the procedure report that was given to you for any specific questions about what was found during the examination.  If the procedure report does not answer your questions, please call your gastroenterologist to clarify.  If you requested that your care partner not be given the details of your procedure findings, then the procedure report has been included in a sealed envelope for you to review at your convenience later.  YOU SHOULD EXPECT: Some feelings of bloating in the abdomen. Passage of more gas than usual.  Walking can help get rid of the air that was put into your GI tract during the procedure and reduce the bloating. If you had a lower endoscopy (such as a colonoscopy or flexible sigmoidoscopy) you may notice spotting of blood in your stool or on the toilet paper. If you underwent a bowel prep for your procedure, you may not have a normal bowel movement for a few days.  Please Note:  You might notice some irritation and congestion in your nose or some drainage.  This is from the oxygen used during your procedure.  There is no need for concern and it should clear up in a day or so.  SYMPTOMS TO REPORT IMMEDIATELY:  Following lower endoscopy (colonoscopy or flexible sigmoidoscopy):  Excessive amounts of blood in the stool  Significant tenderness or worsening of abdominal pains  Swelling of the abdomen that is new, acute  Fever of 100F or higher  For urgent or emergent issues, a gastroenterologist can be reached at any hour by calling [phone]. Do not use MyChart messaging for urgent concerns.    DIET:  We do recommend a small meal at first, but then you may proceed to your regular diet.  Drink plenty of fluids but you should avoid alcoholic  beverages for 24 hours.  ACTIVITY:  You should plan to take it easy for the rest of today and you should NOT DRIVE or use heavy machinery until tomorrow (because of the sedation medicines used during the test).    FOLLOW UP: Our staff will call the number listed on your records the next business day following your procedure.  We will call around 7:15- 8:00 am to check on you and address any questions or concerns that you may have regarding the information given to you following your procedure. If we do not reach you, we will leave a message.     If any biopsies were taken you will be contacted by phone or by letter within the next 1-3 weeks.  Please call us at [phone] if you have not heard about the biopsies in 3 weeks.    SIGNATURES/CONFIDENTIALITY: You and/or your care partner have signed paperwork which will be entered into your electronic medical record.  These signatures attest to the fact that that the information above on your After Visit Summary has been reviewed and is understood.  Full responsibility of the confidentiality of this discharge information lies with you and/or your care-partner.

## 2023-04-17 ENCOUNTER — Telehealth: Payer: Self-pay | Admitting: *Deleted

## 2023-04-17 NOTE — Telephone Encounter (Signed)
  Follow up Call-     04/16/2023    1:05 PM  Call back number  Post procedure Call Back phone  # [phone]  Permission to leave phone message Yes     Patient questions:  Do you have a fever, pain , or abdominal swelling? No. Pain Score  0 *  Have you tolerated food without any problems? Yes.    Have you been able to return to your normal activities? Yes.    Do you have any questions about your discharge instructions: Diet   No. Medications  No. Follow up visit  No.  Do you have questions or concerns about your Care? No.  Actions: * If pain score is 4 or above: No action needed, pain <4.

## 2023-04-19 LAB — SURGICAL PATHOLOGY

## 2023-04-20 ENCOUNTER — Encounter: Payer: Self-pay | Admitting: Gastroenterology
